# Patient Record
Sex: Male | Born: 1965 | Race: White | Hispanic: No | Marital: Single | Smoking: Former smoker
Health system: Southern US, Community
[De-identification: ages and names within clinical notes are randomized; demographics above are authoritative.]

---

## 1985-07-18 DIAGNOSIS — F29 Unspecified psychosis not due to a substance or known physiological condition: Secondary | ICD-10-CM | POA: Insufficient documentation

## 2008-03-26 ENCOUNTER — Ambulatory Visit: Payer: Self-pay | Admitting: Family Medicine

## 2009-09-19 IMAGING — CR DG THORACIC SPINE 2-3V
1 series · 2 of 2 positions shown · non-contrast
Comparison: none

REASON FOR EXAM: thoracic spine pain
COMMENTS:

[Series 2: view not recorded · 0.17mm/px · 2 of 2 slices shown]
[im 1/2]
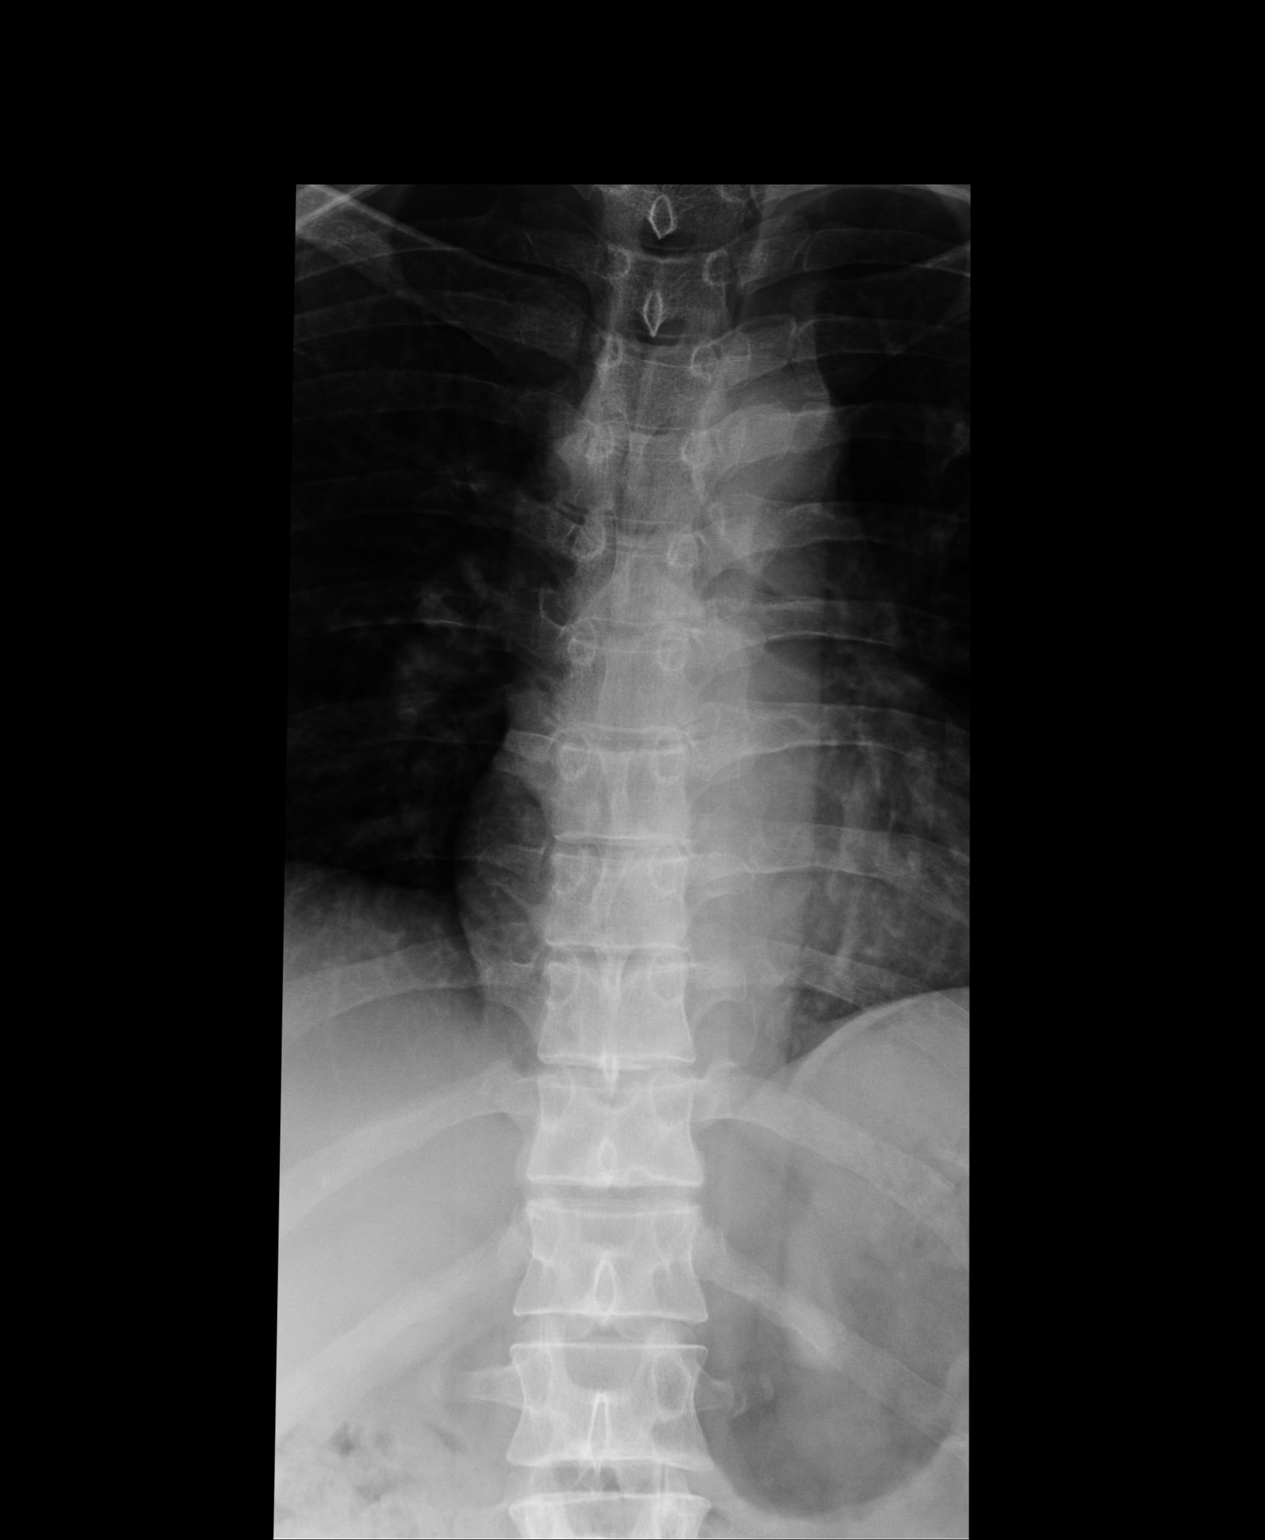
[im 2/2]
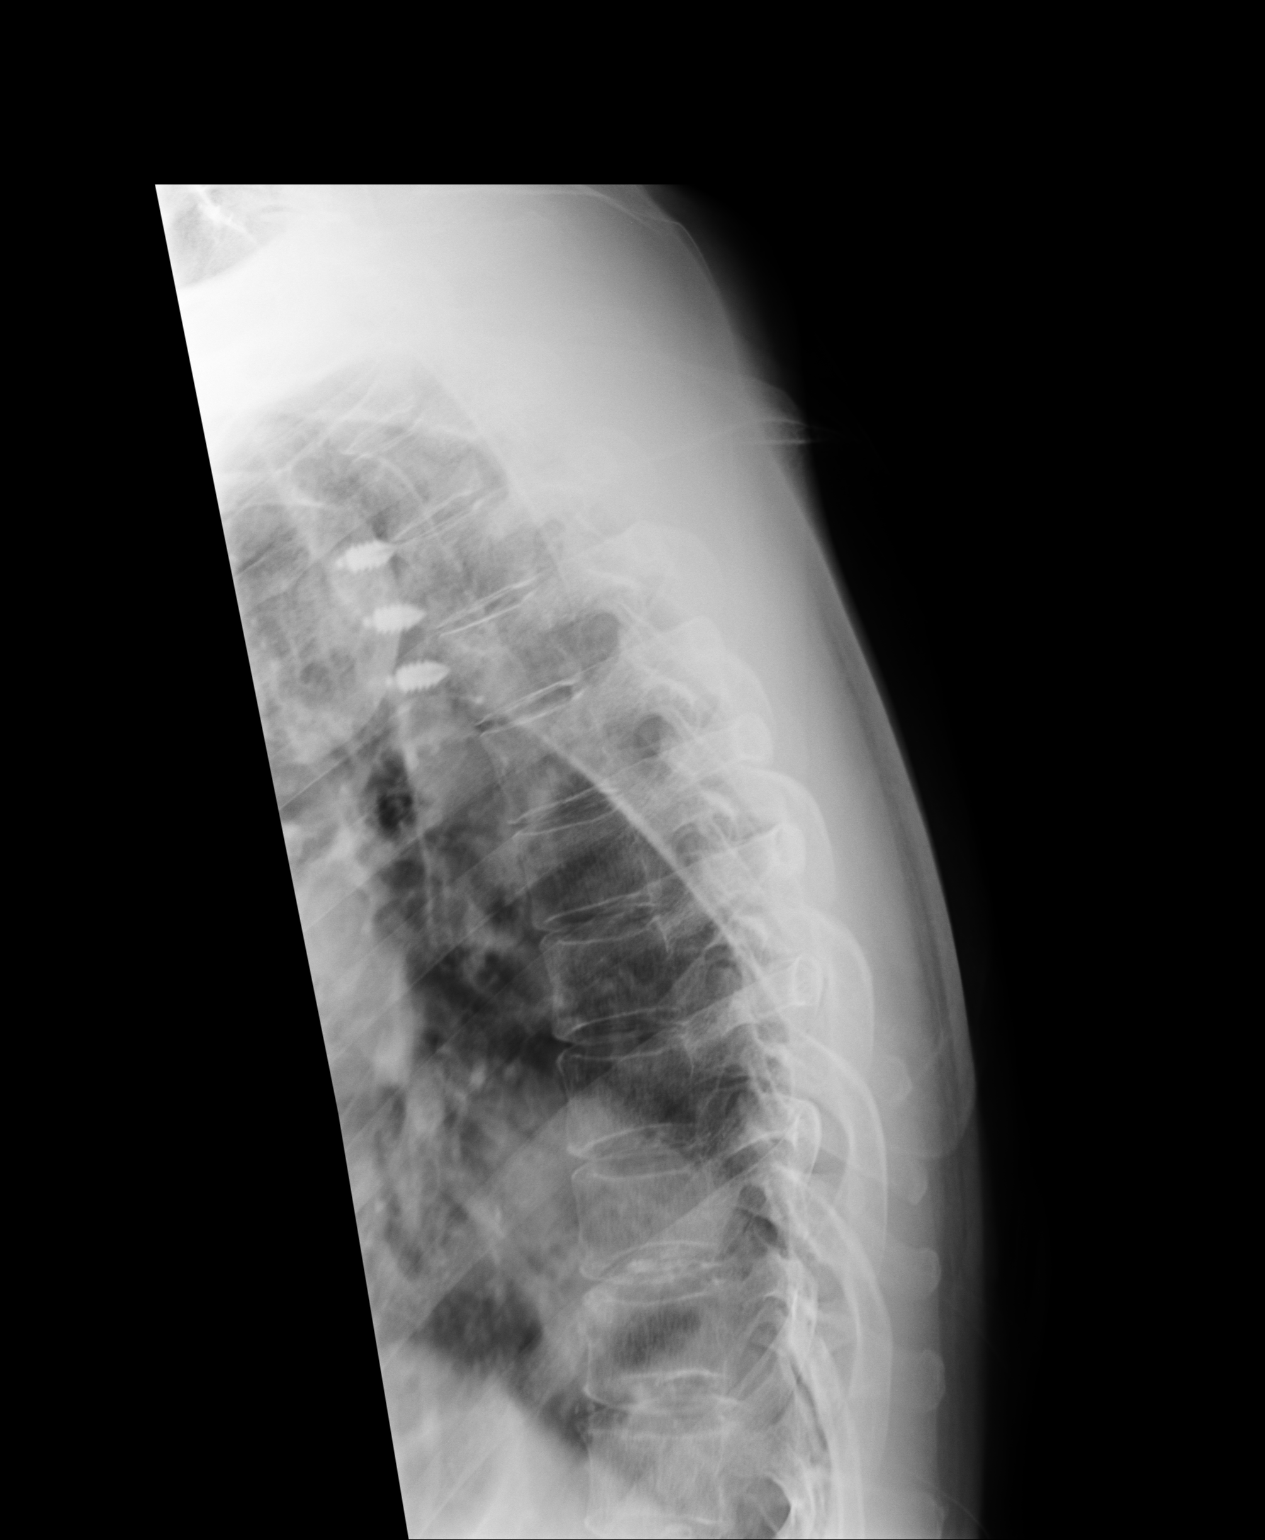

[2 of 2 positions shown; findings below may reference images not displayed]

PROCEDURE:     KDR - KDXR THORACIC AP AND LATERAL  - March 26, 2008 [DATE]

RESULT:     The vertebral body heights and the intervertebral disc spaces
are well maintained. The vertebral body alignment is normal. The pedicles
are bilaterally intact. There is noted a very slight curvature lumbar spine
with convexity to the RIGHT which could either be positional or secondary to
a slight scoliosis.
IMPRESSION: No acute changes are identified.

## 2015-10-05 DIAGNOSIS — Z5900 Homelessness unspecified: Secondary | ICD-10-CM

## 2015-10-05 DIAGNOSIS — IMO0001 Reserved for inherently not codable concepts without codable children: Secondary | ICD-10-CM

## 2015-10-05 NOTE — Congregational Nurse Program (Signed)
Client came to see CN mental health nurse to discuss, "I have not masturbated in one week."  When answering the question "What bothers you most?" this was his answer.  Client then started to discuss his current relationships with women and mentioning, "they have a phobia and I'm very concerned about it."  When questions redirected by CN to how he feels about his situation client did not answer directly and continued the discussion about his sexual and dating habits and about "women asking to be tied up."  Conversation per client tangential, does not answer questions directly about how he feels about his situation or what his choice would be in his encounters with his girlfriend and other women.  He did respond directly to questions related to the CN questionnaire and was compliant in asking them.  Client was asked about what medications he was on and he said he is not on any medications.  CN perceived the need for this client to see a counselor to discuss his relationship issues and he agreed to sign up and see Susanne Bordersyrus [employer] student tomorrow, Tuesday March 21.

## 2016-04-08 DIAGNOSIS — Z5900 Homelessness unspecified: Secondary | ICD-10-CM

## 2016-04-11 NOTE — Congregational Nurse Program (Signed)
Congregational Nurse Program Note  Date of Encounter: 04/08/2016  Past Medical History: No past medical history on file.  Encounter Details:     CNP Questionnaire - 04/08/16 0828      Patient Demographics   Is this a new or existing patient? New   Patient is considered a/an Not Applicable   Race Caucasian/White     Patient Assistance   Location of Patient Assistance Not Applicable   Patient's financial/insurance status Orange Card/Care Connects   Uninsured Patient Yes   Patient referred to apply for the following financial assistance Orange [employer]/Care Connects   Food insecurities addressed Referred to food Product managerbank or resource   Transportation assistance No   Assistance securing medications No   Sales promotion account executiveducational health offerings Behavioral health;Interpersonal relationships;Navigating the healthcare system     Encounter Details   Primary purpose of visit Education/Health Concerns;Navigating the Healthcare System   Was an Emergency Department visit averted? Yes   Does patient have a medical provider? Yes   Patient referred to Follow up with established PCP   Was a mental health screening completed? (GAINS tool) No   Does patient have dental issues? Yes   Does patient have vision issues? No   Since previous encounter, have you referred patient for abnormal blood pressure that resulted in a new diagnosis or medication change? No   Since previous encounter, have you referred patient for abnormal blood glucose that resulted in a new diagnosis or medication change? No     Requested to see Mental health nurse, states he believes he is not able to "get a date" with a woman because they all know he has not climaxed in 30 years.  Believes people at his previous job are talking about this and the women are telling others. 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  Enc[facility]

## 2017-03-24 NOTE — Congregational Nurse Program (Signed)
Congregational Nurse Program Note  Date of Encounter: 03/06/2017  Past Medical History: No past medical history on file.  Encounter Details:     CNP Questionnaire - 03/06/17 0907      Patient Demographics   Is this a new or existing patient? Existing   Patient is considered a/an Not Applicable   Race Caucasian/White     Patient Assistance   Location of Patient Assistance Not Applicable   Patient's financial/insurance status Low Income;Self-Pay (Uninsured)   Uninsured Patient (Orange Research officer, trade unionCard/Care Connects) Yes   Patient referred to apply for the following financial assistance Not Applicable   Food insecurities addressed Not Applicable   Transportation assistance No   Assistance securing medications No   Educational Designer, television/film sethealth offerings Behavioral health     Encounter Details   Primary purpose of visit Other   Was an Emergency Department visit averted? Not Applicable   Does patient have a medical provider? Yes   Patient referred to Not Applicable   Was a mental health screening completed? (GAINS tool) No   Does patient have dental issues? No   Does patient have vision issues? No   Does your patient have an abnormal blood pressure today? No   Since previous encounter, have you referred patient for abnormal blood pressure that resulted in a new diagnosis or medication change? No   Does your patient have an abnormal blood glucose today? No   Since previous encounter, have you referred patient for abnormal blood glucose that resulted in a new diagnosis or medication change? No   Was there a life-saving intervention made? No     States he plans to go to [place] on 8[place]22.  States he has a bus ticket.  He believes he will have more opportunities to acquire the services of a prostitute to prove he is not impotent.   Continues to believe his landlord of 10 years ago is sabatodging his ability to acquire work and housing.  He also believes the past landlord is calling women who he might have  a sexual relationship with a "SI/HI" and not open to counseling or medication

## 2017-03-24 NOTE — Congregational Nurse Program (Signed)
Congregational Nurse Program Note  Date of Encounter: 03/08/2017  Past Medical History: No past medical history on file.  Encounter Details:     CNP Questionnaire - 03/08/17 0912      Patient Demographics   Is this a new or existing patient? Existing   Patient is considered a/an Not Applicable   Race Caucasian/White     Patient Assistance   Location of Patient Assistance Not Applicable   Patient's financial/insurance status Low Income;Self-Pay (Uninsured)   Uninsured Patient (Orange Research officer, trade unionCard/Care Connects) Yes   Patient referred to apply for the following financial assistance Not Applicable   Food insecurities addressed Not Applicable   Transportation assistance No   Assistance securing medications No   Educational Designer, television/film sethealth offerings Behavioral health     Encounter Details   Primary purpose of visit Other   Was an Emergency Department visit averted? Not Applicable   Does patient have a medical provider? Yes   Patient referred to Not Applicable   Was a mental health screening completed? (GAINS tool) No   Does patient have dental issues? No   Does patient have vision issues? No   Does your patient have an abnormal blood pressure today? No   Since previous encounter, have you referred patient for abnormal blood pressure that resulted in a new diagnosis or medication change? No   Does your patient have an abnormal blood glucose today? No   Since previous encounter, have you referred patient for abnormal blood glucose that resulted in a new diagnosis or medication change? No   Was there a life-saving intervention made? No      Client [place] leave [place].  States wants to go to [place] where there is a high school friend of his.  Client has not had contact with this "friend" for several years.  Encouraged client to seek behavioral health services.  Client continues to not be open to services.  Does not exhibit SI/HI

## 2017-03-24 NOTE — Congregational Nurse Program (Signed)
Congregational Nurse Program Note  Date of Encounter: 03/01/2017  Past Medical History: No past medical history on file.  Encounter Details:     CNP Questionnaire - 03/01/17 0859      Patient Demographics   Is this a new or existing patient? New   Patient is considered a/an Not Applicable   Race Caucasian/White     Patient Assistance   Location of Patient Assistance Not Applicable   Patient's financial/insurance status Low Income;Self-Pay (Uninsured)   Uninsured Patient (Orange Research officer, trade unionCard/Care Connects) Yes   Patient referred to apply for the following financial assistance Not Applicable   Food insecurities addressed Not Applicable   Transportation assistance No   Assistance securing medications No   Educational Designer, television/film sethealth offerings Behavioral health     Encounter Details   Primary purpose of visit Other   Was an Emergency Department visit averted? Not Applicable   Does patient have a medical provider? Yes   Patient referred to Not Applicable   Was a mental health screening completed? (GAINS tool) No   Does patient have dental issues? No   Does patient have vision issues? No   Does your patient have an abnormal blood pressure today? No   Since previous encounter, have you referred patient for abnormal blood pressure that resulted in a new diagnosis or medication change? No   Does your patient have an abnormal blood glucose today? No   Since previous encounter, have you referred patient for abnormal blood glucose that resulted in a new diagnosis or medication change? No   Was there a life-saving intervention made? No     Saw client about a year ago, presenting with the same pre-occupation with "needing to have sex with a prostitute".  States he believes past women he has had sex with are telling other women on the street that  he is impotent.  The women he refers to are women who have been in his past several years ago, but are still "talking about" him.  States he believes his  landlord of several years ago are "rigging my phone, keeping me from getting another place to rent.  Client denies have behavioral health issues and does not think he needs medication or counseling.  States he [facility], but that has not helped.

## 2017-05-11 NOTE — Congregational Nurse Program (Signed)
Congregational Nurse Program Note  Date of Encounter: 04/21/2017  Past Medical History: No past medical history on file.  Encounter Details:     CNP Questionnaire - 04/21/17 1842      Patient Demographics   Is this a new or existing patient? Existing   Patient is considered a/an Not Applicable   Race Caucasian/White     Patient Assistance   Location of Patient Assistance Not Applicable   Patient's financial/insurance status Low Income;Self-Pay (Uninsured)   Uninsured Patient (Orange Research officer, trade unionCard/Care Connects) Yes   Patient referred to apply for the following financial assistance Not Applicable   Food insecurities addressed Not Applicable   Transportation assistance No   Assistance securing medications No   Educational Designer, television/film sethealth offerings Behavioral health     Encounter Details   Primary purpose of visit Other   Was an Emergency Department visit averted? Not Applicable   Does patient have a medical provider? Yes   Patient referred to Not Applicable   Was a mental health screening completed? (GAINS tool) No   Does patient have dental issues? No   Does patient have vision issues? No   Does your patient have an abnormal blood pressure today? No   Since previous encounter, have you referred patient for abnormal blood pressure that resulted in a new diagnosis or medication change? No   Does your patient have an abnormal blood glucose today? No   Since previous encounter, have you referred patient for abnormal blood glucose that resulted in a new diagnosis or medication change? No   Was there a life-saving intervention made? No     Client has returned from [place].  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  Discussed with him the risk given his sexual pre-occupation with young girls and strongly suggested he not apply for jobs where he could be at risk for "acting out" sexually.

## 2017-05-11 NOTE — Congregational Nurse Program (Signed)
Congregational Nurse Program Note  Date of Encounter: 04/26/2017  Past Medical History: No past medical history on file.  Encounter Details:     CNP Questionnaire - 04/26/17 1848      Patient Demographics   Is this a new or existing patient? Existing   Patient is considered a/an Not Applicable   Race Caucasian/White     Patient Assistance   Location of Patient Assistance Not Applicable   Patient's financial/insurance status Low Income;Self-Pay (Uninsured)   Uninsured Patient (Orange Research officer, trade unionCard/Care Connects) Yes   Patient referred to apply for the following financial assistance Not Applicable   Food insecurities addressed Not Applicable   Transportation assistance No   Assistance securing medications No   Educational Designer, television/film sethealth offerings Behavioral health     Encounter Details   Primary purpose of visit Other   Was an Emergency Department visit averted? Not Applicable   Does patient have a medical provider? Yes   Patient referred to Not Applicable   Was a mental health screening completed? (GAINS tool) No   Does patient have dental issues? No   Does patient have vision issues? No   Does your patient have an abnormal blood pressure today? No   Since previous encounter, have you referred patient for abnormal blood pressure that resulted in a new diagnosis or medication change? No   Does your patient have an abnormal blood glucose today? No   Since previous encounter, have you referred patient for abnormal blood glucose that resulted in a new diagnosis or medication change? No   Was there a life-saving intervention made? No     States [place] he has a [place] interview with a potential job.  Reality testing is poor.  Continues to refuse counseling

## 2017-06-21 NOTE — Congregational Nurse Program (Signed)
Congregational Nurse Program Note  Date of Encounter: 05/22/2017  Past Medical History: No past medical history on file.  Encounter Details:  [place].  States is still seeking a job and a woman willing to have sex with him.  Client trying to decide whether to stay in [place] or go to [place].  Still voices reluctance about counseling.

## 2017-07-21 NOTE — Congregational Nurse Program (Signed)
Congregational Nurse Program Note  Date of Encounter: 07/21/2017  Past Medical History: No past medical history on file.  Encounter Details: CNP Questionnaire - 07/21/17 1154      Questionnaire   Patient Status  Not Applicable    Race  White or Caucasian    Location Patient Served At  Not Applicable    Insurance  Not Applicable    Uninsured  Uninsured (NEW 1x/quarter)    Food  Yes, have food insecurities;Within past 12 months, worried food would run out with no money to buy more;Within past 12 months, food ran out with no money to buy more    Housing/Utilities  No permanent housing    Transportation  Yes, need transportation assistance    Interpersonal Safety  Yes, feel physically and emotionally safe where you currently live    Medication  Yes, have medication insecurities    Medical Provider  Yes    Referrals  Behavioral/Mental Health Provider    ED Visit Averted  Not Applicable    Life-Saving Intervention Made  Not Applicable      States he thinks he will stay here in [place].  Asked for assistance with getting orange card.  States is looking for a church to attend.  States he is really not religous, but thinks belonging to a group such as that would be helpful.

## 2017-07-21 NOTE — Congregational Nurse Program (Signed)
Congregational Nurse Program Note  Date of Encounter: 07/14/2017  Past Medical History: No past medical history on file.  Encounter Details: CNP Questionnaire - 07/14/17 1144      Questionnaire   Patient Status  Not Applicable    Race  Black or African American    Location Patient Served At  Not Applicable    Insurance  Not Applicable    Uninsured  Uninsured (Subsequent visits/quarter)    Food  Yes, have food insecurities;Within past 12 months, worried food would run out with no money to buy more;Within past 12 months, food ran out with no money to buy more    Housing/Utilities  No permanent housing    Transportation  Yes, need transportation assistance    Interpersonal Safety  Yes, feel physically and emotionally safe where you currently live    Medication  Yes, have medication insecurities    Medical Provider  Yes    Referrals  Behavioral/Mental Health Provider    ED Visit Averted  Not Applicable    Life-Saving Intervention Made  Not Applicable      Continues to express uncertainty as to where he wants to go, [place] or [place].  States [place] is where he lived before, and has friends there, although he has not been in contact with these friends for several years.  Continues to be fixated on paranoid delusion that his former employer of 10-12 years ago is controlling his life.  Also fixated on the belief that "my life would be on track if I could complete having sexual intercourse with an escort".  States did not f/t with [employer].  States [facility] [place] he saw his former employer picture on a Therapist, musicpharmaceutical pamphlet.  He believe [employer] have been in contact with his former employer and the employer "told lies about me".  Client's believes this person uses technology and other creative methods to spy on him and talks to possible employers (which is why he believes he cannot get a job) and talks to the women who might have an interest in him.  Expressed to client  concerns about his general safety (shelter and food).  He states he has applied for winter emergency shelter.  Currently he staying [place] in the woods.

## 2017-07-21 NOTE — Congregational Nurse Program (Signed)
Congregational Nurse Program Note  Date of Encounter: 06/28/2017  Past Medical History: No past medical history on file.  Encounter Details: CNP Questionnaire - 06/28/17 1137      Questionnaire   Patient Status  Not Applicable    Race  Black or African American    Location Patient Served At  Not Applicable    Insurance  Not Applicable    Uninsured  Uninsured (Subsequent visits/quarter)    Food  Yes, have food insecurities;Within past 12 months, worried food would run out with no money to buy more;Within past 12 months, food ran out with no money to buy more    Housing/Utilities  No permanent housing    Transportation  Yes, need transportation assistance    Interpersonal Safety  Yes, feel physically and emotionally safe where you currently live    Medication  Yes, have medication insecurities    Medical Provider  Yes    Referrals  Not Applicable    ED Visit Averted  Not Applicable    Life-Saving Intervention Made  Not Applicable      Well fare check. Still not sure where he wants to go.  Comparing his options.  Encouraged him to look at the realities of his choices:  Transportation and shelter.

## 2017-07-21 NOTE — Congregational Nurse Program (Signed)
Congregational Nurse Program Note  Date of Encounter: 07/06/2017  Past Medical History: No past medical history on file.  Encounter Details: CNP Questionnaire - 07/06/17 1139      Questionnaire   Patient Status  Not Applicable    Race  Black or African American    Location Patient Served At  Not Applicable    Insurance  Not Applicable    Uninsured  Uninsured (Subsequent visits/quarter)    Food  Yes, have food insecurities;Within past 12 months, worried food would run out with no money to buy more;Within past 12 months, food ran out with no money to buy more    Housing/Utilities  No permanent housing    Transportation  Yes, need transportation assistance    Interpersonal Safety  Yes, feel physically and emotionally safe where you currently live    Medication  Yes, have medication insecurities    Medical Provider  Yes    Referrals  Not Applicable    ED Visit Averted  Not Applicable    Life-Saving Intervention Made  Not Applicable      Client continues to ruminate about finding a "mutual partner to have sex with".  States is not interested in a relationship, just wants a male with mutual interest to have sex with.  States he thinks he might go to [place] as there is a woman there who has shown interest in him.  He does not know her name or anything about her, only that he saw her in passing.  He has hopes that going to [place] he can find her and pursue a sexual relationship with her.  Client did state [facility].  Strongly encouraged him to do so.

## 2017-08-21 ENCOUNTER — Ambulatory Visit (HOSPITAL_COMMUNITY)
Admission: EM | Admit: 2017-08-21 | Discharge: 2017-08-21 | Disposition: A | Discharge status: Other Acute Inpatient Hospital | Payer: Self-pay

## 2017-08-25 NOTE — Congregational Nurse Program (Signed)
Congregational Nurse Program Note  Date of Encounter: 07/24/2017  Past Medical History: No past medical history on file.  Encounter Details:  Continues to focus on relocating.  States is applying for jobs but is uncertain as to where he wants to go.  States his priority is to obtain a job so he can pay for an escort.  S.

## 2017-08-25 NOTE — Congregational Nurse Program (Signed)
Congregational Nurse Program Note  Date of Encounter: 08/04/2017  Past Medical History: No past medical history on file.  Encounter Details: CNP Questionnaire - 08/04/17 1050      Questionnaire   Patient Status  Not Applicable    Race  White or Caucasian    Location Patient Served At  Not Applicable    Insurance  Not Applicable    Uninsured  Uninsured (NEW 1x/quarter)    Food  Yes, have food insecurities;Within past 12 months, worried food would run out with no money to buy more;Within past 12 months, food ran out with no money to buy more    Housing/Utilities  No permanent housing    Transportation  Yes, need transportation assistance    Interpersonal Safety  Yes, feel physically and emotionally safe where you currently live    Medication  Yes, have medication insecurities    Medical Provider  Yes    Referrals  Behavioral/Mental Health Provider    ED Visit Averted  Not Applicable    Life-Saving Intervention Made  Not Applicable      States is now working 20 hours a week.  Considering relocating to [place].  Discussed the pro's and con's of relocating now in the winter.  States he thinks he will stay in [place] until March

## 2017-09-18 NOTE — Congregational Nurse Program (Signed)
Congregational Nurse Program Note  Date of Encounter: 09/18/2017  Past Medical History: No past medical history on file.  Encounter Details: CNP Questionnaire - 09/18/17 1512      Questionnaire   Patient Status  Not Applicable    Race  White or Caucasian    Location Patient Served At  Not Applicable    Insurance  Not Applicable    Uninsured  Uninsured (Subsequent visits/quarter)    Food  Yes, have food insecurities;Within past 12 months, worried food would run out with no money to buy more;Within past 12 months, food ran out with no money to buy more    Housing/Utilities  No permanent housing    Transportation  Yes, need transportation assistance    Interpersonal Safety  Yes, feel physically and emotionally safe where you currently live    Medication  Yes, have medication insecurities    Medical Provider  Yes    Referrals  Behavioral/Mental Health Provider    ED Visit Averted  Not Applicable    Life-Saving Intervention Made  Not Applicable      Client reports he has purchased a plane ticket and has decided to relocate to [place].  Is looking for an area to settle where there is a [facility].  Has been applying for jobs in the area.  Encouraged him to follow through with psychiatric care.
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
## 2018-05-24 NOTE — Congregational Nurse Program (Signed)
Did not go to the job interview.  Processing where he wants to go.  Thinking about going to [place].  Discussed mental health treatment.  Client is ambivalent.

## 2018-05-24 NOTE — Congregational Nurse Program (Signed)
Client is requesting assistance with a MH provider.  Client does not want to go to [facility] and he can no longer go to [facility] due to his hypersexual discussions.  Will explore with client options
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
## 2018-06-21 NOTE — Congregational Nurse Program (Signed)
Delusions continue to be present.  Client believes people are "talking" to him and telling him to do things.  These are people he is finding on facebook.  He believes they are telling him through implication to go to [place].  Client has no transportation nor no money.  He is currently at the [facility].  Discussed with client the concerns about safety if he made such a decision.  Client agreed it would be best for him to stay in [place] at least for the winter
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
## 2018-06-21 NOTE — Congregational Nurse Program (Signed)
Client reports he has a job opportunity.  He is scheduled to obtain a UA for drug screen today.  Bus passes given.  Client remains unsure if he wants to stay in [place].  Talking today about going to [place].

## 2018-06-21 NOTE — Congregational Nurse Program (Signed)
Client continues to be ambivalent about staying her or going to [place]
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
## 2018-09-10 ENCOUNTER — Encounter: Payer: Self-pay | Admitting: Pediatric Intensive Care
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
## 2018-09-24 ENCOUNTER — Encounter: Payer: Self-pay | Admitting: Pediatric Intensive Care

## 2018-10-02 ENCOUNTER — Encounter: Payer: Self-pay | Admitting: Pediatric Intensive Care

## 2018-10-26 NOTE — Congregational Nurse Program (Signed)
  Dept: [phone]   Congregational Nurse Program Note  Date of Encounter: 09/24/2018  Past Medical History: No past medical history on file.  Encounter Details:Cleint continues circular thinking regarding "landlord has slandered me" and "I think I need to move to [place] or [place] to get away from the people who are slandering me". Client states wanting to go to [facility] in [facility] for "treatment" but also states that he doesn't have "psychologic problems". CN encouraged client to call [facility] to establish care.  Shann Medal RN BSN CNP [phone]

## 2018-10-26 NOTE — Congregational Nurse Program (Signed)
  Dept: [phone]   Congregational Nurse Program Note  Date of Encounter: 10/02/2018  Past Medical History: No past medical history on file.  Encounter Details:Client checks in to states he is leaving for [place] or [place]. Shann Medal RN BSN CNP [phone]
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
## 2020-10-09 ENCOUNTER — Other Ambulatory Visit: Payer: Self-pay

## 2020-10-09 ENCOUNTER — Encounter: Payer: Self-pay | Admitting: Emergency Medicine

## 2020-10-09 ENCOUNTER — Emergency Department
Admission: EM | Admit: 2020-10-09 | Discharge: 2020-10-12 | Disposition: A | Discharge status: Other Acute Inpatient Hospital | Payer: Self-pay | Attending: Emergency Medicine | Admitting: Emergency Medicine

## 2020-10-09 DIAGNOSIS — F200 Paranoid schizophrenia: Secondary | ICD-10-CM

## 2020-10-09 DIAGNOSIS — F22 Delusional disorders: Secondary | ICD-10-CM | POA: Insufficient documentation

## 2020-10-09 DIAGNOSIS — Z20822 Contact with and (suspected) exposure to covid-19: Secondary | ICD-10-CM | POA: Insufficient documentation

## 2020-10-09 DIAGNOSIS — Z87891 Personal history of nicotine dependence: Secondary | ICD-10-CM | POA: Insufficient documentation

## 2020-10-09 LAB — SALICYLATE LEVEL: Salicylate Lvl: <7 mg/dL — ABNORMAL LOW (ref 7.0–30.0)

## 2020-10-09 LAB — COMPREHENSIVE METABOLIC PANEL
ALT: 33 U/L (ref 0–44)
AST: 34 U/L (ref 15–41)
Albumin: 4.3 g/dL (ref 3.5–5.0)
Alkaline Phosphatase: 52 U/L (ref 38–126)
Anion gap: 9 (ref 5–15)
BUN: 22 mg/dL — ABNORMAL HIGH (ref 6–20)
CO2: 24 mmol/L (ref 22–32)
Calcium: 9.4 mg/dL (ref 8.9–10.3)
Chloride: 104 mmol/L (ref 98–111)
Creatinine, Ser: 1.26 mg/dL — ABNORMAL HIGH (ref 0.61–1.24)
GFR, Estimated: >60 mL/min (ref >60)
Glucose, Bld: 128 mg/dL — ABNORMAL HIGH (ref 70–99)
Potassium: 3.8 mmol/L (ref 3.5–5.1)
Sodium: 137 mmol/L (ref 135–145)
Total Bilirubin: 0.8 mg/dL (ref 0.3–1.2)
Total Protein: 7.2 g/dL (ref 6.5–8.1)

## 2020-10-09 LAB — RESP PANEL BY RT-PCR (FLU A&B, COVID) ARPGX2
Influenza A by PCR: NEGATIVE
Influenza B by PCR: NEGATIVE
SARS Coronavirus 2 by RT PCR: NEGATIVE

## 2020-10-09 LAB — CBC
HCT: 40.8 % (ref 39.0–52.0)
Hemoglobin: 13.8 g/dL (ref 13.0–17.0)
MCH: 29.9 pg (ref 26.0–34.0)
MCHC: 33.8 g/dL (ref 30.0–36.0)
MCV: 88.5 fL (ref 80.0–100.0)
Platelets: 344 10*3/uL (ref 150–400)
RBC: 4.61 MIL/uL (ref 4.22–5.81)
RDW: 12.7 % (ref 11.5–15.5)
WBC: 9.5 10*3/uL (ref 4.0–10.5)
nRBC: 0 % (ref 0.0–0.2)

## 2020-10-09 LAB — ACETAMINOPHEN LEVEL: Acetaminophen (Tylenol), Serum: <10 ug/mL — ABNORMAL LOW (ref 10–30)

## 2020-10-09 LAB — ETHANOL: Alcohol, Ethyl (B): <10 mg/dL (ref <10)

## 2020-10-09 MED ORDER — LORAZEPAM 1 MG PO TABS
1.0000 mg | ORAL_TABLET | Freq: Once | ORAL | Status: AC
  Administered 2020-10-09: 1 mg via ORAL
  Filled 2020-10-09: qty 1
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
## 2020-10-10 DIAGNOSIS — F200 Paranoid schizophrenia: Secondary | ICD-10-CM

## 2020-10-10 LAB — URINE DRUG SCREEN, QUALITATIVE (ARMC ONLY)
Amphetamines, Ur Screen: NOT DETECTED
Barbiturates, Ur Screen: NOT DETECTED
Benzodiazepine, Ur Scrn: NOT DETECTED
Cannabinoid 50 Ng, Ur ~~LOC~~: NOT DETECTED
Cocaine Metabolite,Ur ~~LOC~~: NOT DETECTED
MDMA (Ecstasy)Ur Screen: NOT DETECTED
Methadone Scn, Ur: NOT DETECTED
Opiate, Ur Screen: NOT DETECTED
Phencyclidine (PCP) Ur S: NOT DETECTED
Tricyclic, Ur Screen: NOT DETECTED

## 2020-10-10 MED ORDER — LORAZEPAM 1 MG PO TABS: 1.0000 mg | ORAL_TABLET | Freq: Four times a day (QID) | ORAL | Status: DC | PRN

## 2020-10-10 MED ORDER — OLANZAPINE 5 MG PO TBDP
5.0000 mg | ORAL_TABLET | Freq: Every day | ORAL | Status: DC
  Administered 2020-10-10 – 2020-10-11 (×2): 5 mg via ORAL
  Filled 2020-10-10 (×3): qty 1

## 2020-10-10 NOTE — BH Assessment (Signed)
Referral information for Psychiatric Hospitalization faxed to;   [facility] [phone]- [phone]) [phone]  . [place] [phone]),  . [facility] [phone]),   . [place] [phone] or [phone]),   . [place] [phone] or [phone])

## 2020-10-10 NOTE — ED Notes (Signed)
IVC pending placement 

## 2020-10-10 NOTE — ED Notes (Signed)
Hourly rounding completed at this time, patient currently asleep in room. No complaints, stable, and in no acute distress. Q15 minute rounds and monitoring via Security Cameras to continue. 

## 2020-10-10 NOTE — ED Provider Notes (Signed)
Emergency Medicine Observation Re-evaluation Note  EUGEN JEANSONNE is a 55 y.o. male, seen on rounds today.  Pt initially presented to the ED for complaints of anger issues, Delusional, and Schizophrenia Currently, the patient is sleeping comfortably.  Physical Exam  BP (!) 150/83 (BP Location: Left Arm)   Pulse 73   Temp 99.1 F (37.3 C) (Oral)   Resp 16   Ht 5\' 11"  (1.803 m)   Wt 86.6 kg   SpO2 98%   BMI 26.64 kg/m  Physical Exam Constitutional:      Appearance: He is not ill-appearing or toxic-appearing.  HENT:     Head: Atraumatic.  Cardiovascular:     Comments: Well perfused Pulmonary:     Effort: Pulmonary effort is normal.  Abdominal:     General: There is no distension.  Musculoskeletal:        General: No deformity.  Skin:    Findings: No rash.  Neurological:     General: No focal deficit present.     Cranial Nerves: No cranial nerve deficit.      ED Course / MDM  EKG:   I have reviewed the labs performed to date as well as medications administered while in observation.  Recent changes in the last 24 hours include none.  Plan  Current plan is for inpatient psychiatric care. Patient is under full IVC at this time.   , MD 10/10/20 [phone]

## 2020-10-10 NOTE — ED Notes (Signed)
Pt given hand sanitizer and additional ice water

## 2020-10-10 NOTE — ED Notes (Signed)
Report to chris, rn. Pt to [facility] with security escort.

## 2020-10-10 NOTE — BH Assessment (Signed)
Comprehensive Clinical Assessment (CCA) Note  10/10/2020 Brett Reid [id number]  Chief Complaint: Patient is a 55 year old male presenting to [facility] ED under IVC. Per triage note [name] voluntarily with BPD with c/o of being angry at former landlords blocking pt's attempt to gain employment. Pt denies SI/HI and hallucinations. During assessment patient appears alert and oriented x4, calm and cooperative, patient thought process appears to be disorganized and appears to be having delusions. 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 Patient denies any past mental health issues but has been in a [employer], [employer] program where patient has identified these paranoid delusions for years and has been diagnosed in 45 for Paranoid Schizophrenia by Brett [facility]. Patient denies SI/HI/AH/VH and does not appear to be responding to any internal or external stimuli.  Per Psyc NP Lerry Liner patient is recommended for Inpatient Hospitalization  Chief Complaint  Patient presents with  . anger issues  . Delusional  . Schizophrenia   Visit Diagnosis: Paranoid Schizophrenia   CCA Screening, Triage and Referral (STR)  Patient Reported Information How did you hear about [place]? Legal System  Referral name: No data recorded Referral phone number: No data recorded  Whom do you see for routine medical problems? I don't have a doctor  Practice/Facility Name: No data recorded Practice/Facility Phone Number: No data recorded Name of Contact: No data recorded Contact Number:  No data recorded Contact Fax Number: No data recorded Prescriber Name: No data recorded Prescriber Address (if known): No data recorded  What Is the Reason for Your Visit/Call Today? No data recorded How Long Has This Been Causing You Problems? > than 6 months  What Do You Feel Would Help You the Most Today? No data recorded  Have You Recently Been in Any Inpatient Treatment (Hospital/Detox/Crisis [facility]/28-Day Program)? No  Name/Location of Program/Hospital:No data recorded How Long Were You There? No data recorded When Were You Discharged? No data recorded  Have You Ever Received Services From [facility] Before? No  Who Do You See at [facility]? No data recorded  Have You Recently Had Any Thoughts About Hurting Yourself? No  Are You Planning to Commit Suicide/Harm Yourself At This time? No   Have you Recently Had Thoughts About Hurting Someone Brett Reid? No  Explanation: No data recorded  Have You Used Any Alcohol or Drugs in the Past 24 Hours? No  How Long Ago Did You Use Drugs or Alcohol? No data recorded What Did You Use and How Much? No data recorded  Do You Currently Have a Therapist/Psychiatrist? No  Name of Therapist/Psychiatrist: No data recorded  Have You Been Recently Discharged From Any Office Practice or Programs? No  Explanation of Discharge From Practice/Program: No data recorded    CCA Screening Triage Referral Assessment Type of Contact: Face-to-Face  Is this Initial or Reassessment? No data recorded Date Telepsych consult ordered in [facility]:  No data recorded Time Telepsych consult ordered in [facility]:  No data recorded  Patient Reported Information Reviewed? Yes  Patient Left Without  Being Seen? No data recorded Reason for Not Completing Assessment: No data recorded  Collateral Involvement: No data recorded  Does Patient Have a Court Appointed Legal Guardian? No data recorded Name and Contact of Legal Guardian: No data recorded If Minor and Not  Living with Parent(s), Who has Custody? No data recorded Is CPS involved or ever been involved? Never  Is APS involved or ever been involved? Never   Patient Determined To Be At Risk for Harm To Self or Others Based on Review of Patient Reported Information or Presenting Complaint? No  Method: No data recorded Availability of Means: No data recorded Intent: No data recorded Notification Required: No data recorded Additional Information for Danger to Others Potential: No data recorded Additional Comments for Danger to Others Potential: No data recorded Are There Guns or Other Weapons in Your Home? No data recorded Types of Guns/Weapons: No data recorded Are These Weapons Safely Secured?                            No data recorded Who Could Verify You Are Able To Have These Secured: No data recorded Do You Have any Outstanding Charges, Pending Court Dates, Parole/Probation? No data recorded Contacted To Inform of Risk of Harm To Self or Others: No data recorded  Location of Assessment: [facility]   Does Patient Present under Involuntary Commitment? Yes  IVC Papers Initial File Date: 10/10/2020   [place] of Residence: Brett Reid   Patient Currently Receiving the Following Services: No data recorded  Determination of Need: Emergent (2 hours)   Options For Referral: No data recorded    CCA Biopsychosocial Intake/Chief Complaint:  Patient presents due to anger issues  Current Symptoms/Problems: Patient presents due to anger issues, but appears to be having some delusions   Patient Reported Schizophrenia/Schizoaffective Diagnosis in Past: Yes   Strengths: Patietn is able to communicate  Preferences: Unknown  Abilities: Unknown   Type of Services Patient Feels are Needed: Unknown   Initial Clinical Notes/Concerns: None   Mental Health Symptoms Depression:  None   Duration of Depressive symptoms: No data recorded  Mania:  Racing thoughts   Anxiety:   Difficulty  concentrating; Worrying   Psychosis:  Delusions   Duration of Psychotic symptoms: Greater than six months   Trauma:  None   Obsessions:  Recurrent & persistent thoughts/impulses/images   Compulsions:  Absent insight/delusional   Inattention:  None   Hyperactivity/Impulsivity:  N/A   Oppositional/Defiant Behaviors:  None   Emotional Irregularity:  None   Other Mood/Personality Symptoms:  No data recorded   Mental Status Exam Appearance and self-care  Stature:  Average   Weight:  Average weight   Clothing:  Casual   Grooming:  Normal   Cosmetic use:  None   Posture/gait:  Normal   Motor activity:  Not Remarkable   Sensorium  Attention:  Inattentive   Concentration:  Focuses on irrelevancies   Orientation:  X5   Recall/memory:  Normal   Affect and Mood  Affect:  Congruent   Mood:  Anxious   Relating  Eye contact:  Normal   Facial expression:  Responsive   Attitude toward examiner:  Cooperative   Thought and Language  Speech flow: Flight of Ideas   Thought content:  Delusions; Suspicious   Preoccupation:  Ruminations   Hallucinations:  None   Organization:  No data recorded  [employer] of Knowledge:  Fair   Intelligence:  Average   Abstraction:  Normal   Judgement:  Impaired   Reality Testing:  Distorted   Insight:  Lacking; Poor   Decision Making:  Normal   Social Functioning  Social Maturity:  Responsible   Social Judgement:  Normal   Stress  Stressors:  Housing   Coping Ability:  Contractor Deficits:  None   Supports:  Support needed     Religion: Religion/Spirituality Are You A Religious Person?: No  Leisure/Recreation: Leisure / Recreation Do You Have Hobbies?: No  Exercise/Diet: Exercise/Diet Do You Exercise?: No Have You Gained or Lost A Significant Amount of Weight in the Past Six Months?: No Do You Follow a Special Diet?: No Do You Have Any Trouble Sleeping?: No   CCA  Employment/Education Employment/Work Situation: Employment / Work Situation Employment situation: Biomedical scientist job has been impacted by current illness: No Has patient ever been in the [employer]?:  (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)  Education: Education Is Patient Currently Attending School?: No Did Garment/textile technologist From [employer]?:  (Unknown)   CCA Family/Childhood History Family and Relationship History: Family history Marital status: Single Are you sexually active?:  (Unknown) What is your sexual orientation?: Unknown Has your sexual activity been affected by drugs, alcohol, medication, or emotional stress?: Unknown Does patient have children?:  (Unknown)  Childhood History:  Childhood History Additional childhood history information: None reported Description of patient's relationship with caregiver when they were a child: None reported Patient's description of current relationship with people who raised him/her: None reported How were you disciplined when you got in trouble as a child/adolescent?: None reported Does patient have siblings?: No Did patient suffer any verbal/emotional/physical/sexual abuse as a child?: No Did patient suffer from severe childhood neglect?: No Has patient ever been sexually abused/assaulted/raped as an adolescent or adult?: No Was the patient ever a victim of a crime or a disaster?: No Witnessed domestic violence?: No Has patient been affected by domestic violence as an adult?: No  Child/Adolescent Assessment:     CCA Substance Use Alcohol/Drug Use: Alcohol / Drug Use Pain Medications: See MAR Prescriptions: See MAR Over the Counter: See MAR History of alcohol / drug use?: Yes Substance #1 Name of Substance 1: Alcohol 1 - Last Use / Amount: Reports occasional use of alcohol 1- Route of Use: Oral                       ASAM's:  Six Dimensions of Multidimensional  Assessment  Dimension 1:  Acute Intoxication and/or Withdrawal Potential:      Dimension 2:  Biomedical Conditions and Complications:      Dimension 3:  Emotional, Behavioral, or Cognitive Conditions and Complications:     Dimension 4:  Readiness to Change:     Dimension 5:  Relapse, Continued use, or Continued Problem Potential:     Dimension 6:  Recovery/Living Environment:     ASAM Severity Score:    ASAM Recommended Level of Treatment:     Substance use Disorder (SUD)    Recommendations for Services/Supports/Treatments:   Per Psyc NP Rashaun Dixon patient is recommended for Inpatient Hospitalization  DSM5 Diagnoses: Patient Active Problem List   Diagnosis Date Noted  . Paranoid schizophrenia (HCC) 10/09/2020    Patient Centered Plan: Patient is on the following Treatment Plan(s):  Schizophrenia   Referrals to Alternative Service(s): Referred to Alternative Service(s):   Place:   Date:   Time:  Referred to Alternative Service(s):   Place:   Date:   Time:    Referred to Alternative Service(s):   Place:   Date:   Time:    Referred to Alternative Service(s):   Place:   Date:   Time:     Sirena Riddle A Jazara Swiney, LCAS-A

## 2020-10-10 NOTE — ED Notes (Signed)
Patient transferred from ED to [facility] room 2 after screening for contraband. Report received from April RN including Situation, Background, Assessment and Recommendations. Pt oriented to unit including Q15 minute rounds as well as the security cameras for their protection. Patient is alert and oriented, warm and dry in no acute distress. Patient denies SI, HI, and AVH. Pt. Encouraged to let this nurse know if needs arise.

## 2020-10-10 NOTE — Consult Note (Addendum)
[facility] Face-to-Face Psychiatry Consult   Reason for Consult: Psychiatric consult Referring Physician: EDP Patient Identification: Brett Reid MRN:  [id number] Principal Diagnosis: Paranoid schizophrenia (HCC) Diagnosis:  Principal Problem:   Paranoid schizophrenia (HCC)   Total Time spent with patient: 45 minutes  Subjective:   Brett Reid is a 55 y.o. male patient admitted "I was having anger issues "  HPI:  Assessment  Brett Reid, 55 y.o., male patient presented to [facility].  Patient seen  by TTS and this provider; chart reviewed and consulted with Dr. Lucianne Muss on 10/10/20.  No psychiatric background, history or diagnosis was found in [facility] health EMR.  However on care everywhere, there was 1 medical documentation from the [facility]. The records shows patient was diagnosed with paranoid schizophrenia in 4.  There are several Educational psychologist program notes that state patient presented with similar complaints of landlord spying on him, tarnishing his reputation, and preventing him from obtaining employment. While reading those notes, writer was able to see patient encounters as far back as October 05, 2015 where often patient ruminated on sexual intercourse with random women, and the same statements that he made tonight in reference to his landlord trying to sabotage his employment.  At the end of each note it is recommended that patient seek mental health counseling and treatment.  Each time the patient declined.  Tonight he presents with the same delusions.    During evaluation Brett Reid is sitting on the bed; he is alert/oriented x 4; calm/cooperative; and mood congruent with affect.  Patient is speaking in a clear tone at moderate volume, and normal pace; with good eye contact.  His thought process is incoherent and irrelevant; There is no indication that he is currently responding to internal/external stimuli.  Patient is experiencing delusional thought content (variable  viable through EMR review ) and extreme paranoia.  Patient denies suicidal/self-harm and homicidal ideation. Patient has remained calm throughout assessment.   Current risk factors for patient are homelessness, medication noncompliance, delusional thoughts, paranoia and age and demographics.  Recommendations:  Psychiatric inpatient hospitalization for medication management and psychiatric stabilization.  It appears that the last time patient received treatment for his diagnosis of paranoid schizophrenia was in 2007 at the [place] in [place].  Patient states he is not currently taking any medication and he denies having a mental health background.   Past Psychiatric History: Paranoid schizophrenia  Risk to Self:  No Risk to Others:  Possibly Prior Inpatient Therapy:  No Prior Outpatient Therapy:  No  Past Medical History: History reviewed. No pertinent past medical history. History reviewed. No pertinent surgical history. Family History: History reviewed. No pertinent family history. Family Psychiatric  History: Unknown Social History:  Social History   Substance and Sexual Activity  Alcohol Use Not Currently     Social History   Substance and Sexual Activity  Drug Use Not Currently    Social History   Socioeconomic History  . Marital status: Single    Spouse name: Not on file  . Number of children: Not on file  . Years of education: Not on file  . Highest education level: Not on file  Occupational History  . Not on file  Tobacco Use  . Smoking status: Former Games developer  . Smokeless tobacco: Never Used  Substance and Sexual Activity  . Alcohol use: Not Currently  . Drug use: Not Currently  . Sexual activity: Not on file  Other Topics Concern  . Not on  file  Social History Narrative  . Not on file   Social Determinants of Health   Financial Resource Strain: Not on file  Food Insecurity: Not on file  Transportation Needs: Not on file  Physical Activity: Not on file  Stress:  Not on file  Social Connections: Not on file   Additional Social History:    Allergies:   Allergies  Allergen Reactions  . Haloperidol     Other reaction(s): NEAR SYNCOPE    Labs:  Results for orders placed or performed during the hospital encounter of 10/09/20 (from the past 48 hour(s))  Comprehensive metabolic panel     Status: Abnormal   Collection Time: 10/09/20  9:42 PM  Result Value Ref Range   Sodium 137 135 - 145 mmol/L   Potassium 3.8 3.5 - 5.1 mmol/L   Chloride 104 98 - 111 mmol/L   CO2 24 22 - 32 mmol/L   Glucose, Bld 128 (H) 70 - 99 mg/dL    Comment: Glucose reference range applies only to samples taken after fasting for at least 8 hours.   BUN 22 (H) 6 - 20 mg/dL   Creatinine, Ser 4.70 (H) 0.61 - 1.24 mg/dL   Calcium 9.4 8.9 - 96.2 mg/dL   Total Protein 7.2 6.5 - 8.1 g/dL   Albumin 4.3 3.5 - 5.0 g/dL   AST 34 15 - 41 U/L   ALT 33 0 - 44 U/L   Alkaline Phosphatase 52 38 - 126 U/L   Total Bilirubin 0.8 0.3 - 1.2 mg/dL   GFR, Estimated >83 >66 mL/min    Comment: (NOTE) Calculated using the CKD-EPI Creatinine Equation (2021)    Anion gap 9 5 - 15    Comment: Performed at [facility], [address] [place], [place] [address]  Ethanol     Status: None   Collection Time: 10/09/20  9:42 PM  Result Value Ref Range   Alcohol, Ethyl (B) <10 <10 mg/dL    Comment: (NOTE) Lowest detectable limit for serum alcohol is 10 mg/dL.  For medical purposes only. Performed at [facility], [address] [place], [place] [address]   cbc     Status: None   Collection Time: 10/09/20  9:42 PM  Result Value Ref Range   WBC 9.5 4.0 - 10.5 K/uL   RBC 4.61 4.22 - 5.81 MIL/uL   Hemoglobin 13.8 13.0 - 17.0 g/dL   HCT 35.4 65.6 - 81.2 %   MCV 88.5 80.0 - 100.0 fL   MCH 29.9 26.0 - 34.0 pg   MCHC 33.8 30.0 - 36.0 g/dL   RDW 75.1 70.0 - 17.4 %   Platelets 344 150 - 400 K/uL   nRBC 0.0 0.0 - 0.2 %    Comment: Performed at [facility], [address], [place], [place] [address]  Acetaminophen level     Status: Abnormal   Collection Time: 10/09/20  9:42 PM  Result Value Ref Range   Acetaminophen (Tylenol), Serum <10 (L) 10 - 30 ug/mL    Comment: (NOTE) Therapeutic concentrations vary significantly. A range of 10-30 ug/mL  may be an effective concentration for many patients. However, some  are best treated at concentrations outside of this range. Acetaminophen concentrations >150 ug/mL at 4 hours after ingestion  and >50 ug/mL at 12 hours after ingestion are often associated with  toxic reactions.  Performed at [facility], [address] [place], [place] [address]   Salicylate level  Status: Abnormal   Collection Time: 10/09/20  9:42 PM  Result Value Ref Range   Salicylate Lvl <7.0 (L) 7.0 - 30.0 mg/dL    Comment: Performed at [facility], [address] [place], [place] [address]  Resp Panel by RT-PCR (Flu A&B, Covid) Nasopharyngeal Swab     Status: None   Collection Time: 10/09/20 10:45 PM   Specimen: Nasopharyngeal Swab; Nasopharyngeal(NP) swabs in vial transport medium  Result Value Ref Range   SARS Coronavirus 2 by RT PCR NEGATIVE NEGATIVE    Comment: (NOTE) SARS-CoV-2 target nucleic acids are NOT DETECTED.  The SARS-CoV-2 RNA is generally detectable in upper respiratory specimens during the acute phase of infection. The lowest concentration of SARS-CoV-2 viral copies this assay can detect is 138 copies/mL. A negative result does not preclude SARS-Cov-2 infection and should not be used as the sole basis for treatment or other patient management decisions. A negative result may occur with  improper specimen collection/handling, submission of specimen other than nasopharyngeal swab, presence of viral mutation(s) within the areas targeted by this assay, and inadequate number of viral copies(<138 copies/mL). A negative result must be combined with clinical observations, patient history,  and epidemiological information. The expected result is Negative.  Fact Sheet for Patients:  BloggerCourse.comhttps://www.fda.gov/media/152166/download  Fact Sheet for Healthcare Providers:  SeriousBroker.ithttps://www.fda.gov/media/152162/download  This test is no t yet approved or cleared by the [place] FDA and  has been authorized for detection and/or diagnosis of SARS-CoV-2 by FDA under an Emergency Use Authorization (EUA). This EUA will remain  in effect (meaning this test can be used) for the duration of the COVID-19 declaration under Section 564(b)(1) of the Act, 21 U.S.C.section 360bbb-3(b)(1), unless the authorization is terminated  or revoked sooner.       Influenza A by PCR NEGATIVE NEGATIVE   Influenza B by PCR NEGATIVE NEGATIVE    Comment: (NOTE) The Xpert Xpress SARS-CoV-2/FLU/RSV plus assay is intended as an aid in the diagnosis of influenza from Nasopharyngeal swab specimens and should not be used as a sole basis for treatment. Nasal washings and aspirates are unacceptable for Xpert Xpress SARS-CoV-2/FLU/RSV testing.  Fact Sheet for Patients: BloggerCourse.comhttps://www.fda.gov/media/152166/download  Fact Sheet for Healthcare Providers: SeriousBroker.ithttps://www.fda.gov/media/152162/download  This test is not yet approved or cleared by the [place] FDA and has been authorized for detection and/or diagnosis of SARS-CoV-2 by FDA under an Emergency Use Authorization (EUA). This EUA will remain in effect (meaning this test can be used) for the duration of the COVID-19 declaration under Section 564(b)(1) of the Act, 21 U.S.C. section 360bbb-3(b)(1), unless the authorization is terminated or revoked.  Performed at [facility], [address] [place], [place] [address]     No current facility-administered medications for this encounter.   No current outpatient medications on file.    Musculoskeletal: Strength & Muscle Tone: within normal limits Gait & Station: normal Patient leans:  N/A  Psychiatric Specialty Exam:  Presentation  General Appearance: Appropriate for Environment; Casual  Eye Contact:Fair  Speech:Clear and Coherent  Speech Volume:Normal  Handedness:Right   Mood and Affect  Mood:Anxious  Affect:Appropriate   Thought Process  Thought Processes:Coherent  Descriptions of Associations:Intact  Orientation:Full (Time, Place and Person)  Thought Content:Delusions  History of Schizophrenia/Schizoaffective disorder:Yes  Duration of Psychotic Symptoms:Greater than six months  Hallucinations:Hallucinations: None  Ideas of Reference:Delusions; Paranoia; Percusatory  Suicidal Thoughts:Suicidal Thoughts: No  Homicidal Thoughts:Homicidal Thoughts: No   Sensorium  Memory:Remote Fair; Immediate Poor  Judgment:Impaired  Insight:Lacking   Art therapistxecutive Functions  Concentration:Fair  Attention Span:Fair  Recall:Fair  Fund of Knowledge:Fair  Language:Fair   Psychomotor Activity  Psychomotor Activity:Psychomotor Activity: Normal   Assets  Assets:Communication Skills; Physical Health   Sleep  Sleep:No data recorded  Physical Exam: Physical Exam Vitals and nursing note reviewed.  HENT:     Head: Normocephalic and atraumatic.     Nose: Nose normal.  Eyes:     Pupils: Pupils are equal, round, and reactive to light.  Pulmonary:     Effort: Pulmonary effort is normal.  Musculoskeletal:        General: Normal range of motion.     Cervical back: Normal range of motion.  Skin:    General: Skin is warm.  Neurological:     General: No focal deficit present.     Mental Status: He is alert and oriented to person, place, and time.  Psychiatric:        Attention and Perception: Attention normal.        Mood and Affect: Mood is anxious.        Speech: Speech normal.        Behavior: Behavior is cooperative.        Thought Content: Thought content is paranoid and delusional.        Cognition and Memory: Cognition is impaired.  Memory is impaired.        Judgment: Judgment is impulsive and inappropriate.    ROS Blood pressure (!) 150/83, pulse 73, temperature 99.1 F (37.3 C), temperature source Oral, resp. rate 16, height 5\' 11"  (1.803 m), weight 86.6 kg, SpO2 98 %. Body mass index is 26.64 kg/m.  Treatment Plan Summary: Daily contact with patient to assess and evaluate symptoms and progress in treatment, Medication management and Plan Inpatient hospitalization.  Start patient on antipsychotic at the EKG and labs.  Disposition: Recommend psychiatric Inpatient admission when medically cleared. Supportive therapy provided about ongoing stressors. Discussed crisis plan, support from social network, calling 911, coming to the Emergency Department, and calling Suicide Hotline.  , NP 10/10/2020 12:16 AM statement

## 2020-10-10 NOTE — ED Notes (Signed)
Shower offered patient declined  

## 2020-10-10 NOTE — ED Notes (Addendum)
Call to TTS, Brett Reid, for requesting medication regime for pt, psych to contact me back for same

## 2020-10-10 NOTE — BH Assessment (Signed)
This Clinical research associate contacted the following VA facilities due to patient's history of being treated at VA's in the past:  [facility] [phone] [phone]- AOD reports patient has not been treated at [facility] in a long time and [place] is currently on diversion  [place] [phone]- reports to be on capacity alert which also means diversion  [place] [phone]- AOD reports to be on diversion  [place] [phone]- AOD reports to be on diversion

## 2020-10-10 NOTE — ED Notes (Signed)
Pt asleep at this time, unable to collect vitals. Will collect pt vitals once awake. 

## 2020-10-10 NOTE — BH Assessment (Signed)
Writer followed up with referrals faxed to;    [facility] ([place]-[phone] -or- [phone]), Pending review   [facility] ([phone]---[phone]), unable to reach anyone. Information refaxed.   [facility] ([place]-[phone]), Pending review.   [facility] [phone] or [phone]), Unable to reach anyone. Information refaxed.   [facility] ([place]licia-[phone] or [phone]), No beds  Referral information faxed to;   [facility] [place] [phone]),   . [facility] [phone], [phone], [phone] or [phone]),   . [place] [phone] or [phone])  . [facility] [phone]).

## 2020-10-11 NOTE — ED Notes (Signed)
Pt given lunch tray.

## 2020-10-11 NOTE — ED Notes (Signed)
Breakfast given at this time.  

## 2020-10-11 NOTE — ED Notes (Signed)
Pt given shower supplies, in shower at this time. °

## 2020-10-11 NOTE — ED Notes (Signed)
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 were the pt would have the opportunity to speak to people "like [employer], or [place] or even [place], but I wouldn't ask for their autograph and that would demonstrate to a prospective girlfriend, of about my age, that I was ready for a relationship"  Pt with even speech rate and moderate tone but appears unable to explain the significance of these ideas, but pt is hyper-aware of the idea of being watched and judged by others for his actions, pt did report that he was anxious to be able to work again and wants Child psychotherapist to help in that regard, pt also requesting further evaluation from psych to determine if pt can leave, pt admits recently (within the last week) sleeping outside four or five nights but won't admit homelessness

## 2020-10-11 NOTE — ED Notes (Signed)
Hourly rounding reveals patient in room. No complaints, stable, in no acute distress. Q15 minute rounds and monitoring via Security Cameras to continue. 

## 2020-10-11 NOTE — ED Notes (Signed)
Report to include Situation, Background, Assessment, and Recommendations received from Sarah RN. Patient alert and oriented, warm and dry, in no acute distress. Patient denies SI, HI, AVH and pain. Patient made aware of Q15 minute rounds and security cameras for their safety. Patient instructed to come to me with needs or concerns.  

## 2020-10-11 NOTE — ED Notes (Signed)
Pt given dinner tray plus ice water.

## 2020-10-11 NOTE — BH Assessment (Signed)
Writer followed up with referrals faxed to;   [facility] ([phone]- [phone]), admission staff wasn't available. Took information and will call back.   [place] ([phone]---[phone]), unable to reach anyone.   [facility] (Jamie-[phone]), Information refaxed.   [place] ([place]-[phone] or [phone]), Pending review, to talk with their doctor. Writer followed up, but was unable to reach anyone.   [place] [phone]), Not accepting outside beds   [facility] ([place]-[phone]-or- [phone]), requested to refax with information with VA benefits   [facility] [phone], [phone], [phone] or [phone]), unable to reach anyone.   [place] ([phone] or [phone]), No beds   [facility] [phone]), left message for return phone call.

## 2020-10-11 NOTE — ED Notes (Signed)
Pt out of shower in rm at this time.

## 2020-10-11 NOTE — ED Provider Notes (Signed)
Emergency Medicine Observation Re-evaluation Note  Brett Reid is a 55 y.o. male, seen on rounds today.  Pt initially presented to the ED for complaints of anger issues, Delusional, and Schizophrenia Currently, the patient is sitting up, reports he slept well last night, currently awaiting breakfast.  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  Physical Exam  BP (!) 134/91 (BP Location: Right Arm)   Pulse 72   Temp (!) 97.4 F (36.3 C) (Oral)   Resp 18   Ht 5\' 11"  (1.803 m)   Wt 86.6 kg   SpO2 96%   BMI 26.64 kg/m  Physical Exam Constitutional:      Appearance: He is not ill-appearing or toxic-appearing.  HENT:     Head: Atraumatic.  Cardiovascular:     Comments: Well perfused Pulmonary:     Effort: Pulmonary effort is normal.  Abdominal:     General: There is no distension.  Musculoskeletal:        General: No deformity.  Skin:    Findings: No rash.  Neurological:     General: No focal deficit present.     Cranial Nerves: No cranial nerve deficit.      ED Course / MDM  EKG:   I have reviewed the labs performed to date as well as medications administered while in observation.  Recent changes in the last 24 hours include none.  Plan  Current plan is for inpatient psychiatric care. Patient is under full IVC at this time.     , MD 10/11/20 [phone]

## 2020-10-11 NOTE — ED Notes (Signed)
Pt provided snacks and drinks 

## 2020-10-12 ENCOUNTER — Other Ambulatory Visit: Payer: Self-pay

## 2020-10-12 ENCOUNTER — Inpatient Hospital Stay
Admission: RE | Admit: 2020-10-12 | Discharge: 2020-10-14 | DRG: 885 | Disposition: A | Discharge status: Home / Self Care | Payer: 59 | Source: Intra-hospital | Attending: Behavioral Health | Admitting: Behavioral Health

## 2020-10-12 ENCOUNTER — Encounter: Payer: Self-pay | Admitting: Psychiatry

## 2020-10-12 DIAGNOSIS — F22 Delusional disorders: Secondary | ICD-10-CM | POA: Diagnosis present

## 2020-10-12 DIAGNOSIS — Z5901 Sheltered homelessness: Secondary | ICD-10-CM

## 2020-10-12 DIAGNOSIS — R45850 Homicidal ideations: Secondary | ICD-10-CM | POA: Diagnosis present

## 2020-10-12 DIAGNOSIS — F200 Paranoid schizophrenia: Secondary | ICD-10-CM | POA: Insufficient documentation

## 2020-10-12 DIAGNOSIS — R45851 Suicidal ideations: Secondary | ICD-10-CM | POA: Diagnosis present

## 2020-10-12 DIAGNOSIS — I10 Essential (primary) hypertension: Secondary | ICD-10-CM | POA: Diagnosis present

## 2020-10-12 MED ORDER — ACETAMINOPHEN 325 MG PO TABS: 650.0000 mg | ORAL_TABLET | Freq: Four times a day (QID) | ORAL | Status: DC | PRN

## 2020-10-12 MED ORDER — ALUM & MAG HYDROXIDE-SIMETH 200-200-20 MG/5ML PO SUSP: 30.0000 mL | ORAL | Status: DC | PRN

## 2020-10-12 MED ORDER — MAGNESIUM HYDROXIDE 400 MG/5ML PO SUSP
30.0000 mL | Freq: Every day | ORAL | Status: DC | PRN
Start: 2020-10-12 — End: 2020-10-14

## 2020-10-12 MED ORDER — OLANZAPINE 5 MG PO TBDP
5.0000 mg | ORAL_TABLET | Freq: Every day | ORAL | Status: DC
  Administered 2020-10-12: 5 mg via ORAL
  Filled 2020-10-12: qty 1

## 2020-10-12 MED ORDER — HYDROXYZINE HCL 50 MG PO TABS
50.0000 mg | ORAL_TABLET | Freq: Three times a day (TID) | ORAL | Status: DC | PRN
Start: 2020-10-12 — End: 2020-10-14

## 2020-10-12 NOTE — ED Notes (Signed)
Pt taking shower, given new scrubs, socks, and underwear.

## 2020-10-12 NOTE — ED Notes (Signed)
Hourly rounding reveals patient in room. No complaints, stable, in no acute distress. Q15 minute rounds and monitoring via Security Cameras to continue. 

## 2020-10-12 NOTE — ED Provider Notes (Signed)
Emergency Medicine Observation Re-evaluation Note  Brett Reid is a 55 y.o. male, seen on rounds today.  Pt initially presented to the ED for complaints of anger issues, Delusional, and Schizophrenia Currently, the patient is resting, no acute complaints.  Physical Exam  BP (!) 130/96 (BP Location: Right Arm)   Pulse 73   Temp 98.2 F (36.8 C) (Oral)   Resp 18   Ht 5\' 11"  (1.803 m)   Wt 86.6 kg   SpO2 97%   BMI 26.64 kg/m  Physical Exam General: no acute distress Cardiac: normal rate Lungs: equal chest rise Psych: calm  ED Course / MDM  EKG:   I have reviewed the labs performed to date as well as medications administered while in observation.  Recent changes in the last 24 hours include none.  Plan  Current plan is for admission, pending acceptance. Patient is under full IVC at this time.   , MD 10/12/20 [phone]

## 2020-10-12 NOTE — ED Notes (Signed)
IVC/ Pending Admit to BMU  

## 2020-10-12 NOTE — ED Notes (Signed)
Dinner tray provided

## 2020-10-12 NOTE — Tx Team (Signed)
Initial Treatment Plan 10/12/2020 8:55 PM Brett Reid [id number]    PATIENT STRESSORS: Marital or family conflict Occupational concerns   PATIENT STRENGTHS: Ability for insight Supportive family/friends   PATIENT IDENTIFIED PROBLEMS: Anger  Depression                   DISCHARGE CRITERIA:  Improved stabilization in mood, thinking, and/or behavior Verbal commitment to aftercare and medication compliance  PRELIMINARY DISCHARGE PLAN: Outpatient therapy Return to previous living arrangement  PATIENT/FAMILY INVOLVEMENT: This treatment plan has been presented to and reviewed with the patient, Brett Reid. The patient has been given the opportunity to ask questions and make suggestions.  Elmyra Ricks, RN 10/12/2020, 8:55 PM

## 2020-10-12 NOTE — ED Notes (Signed)
Pt given lunch tray.

## 2020-10-12 NOTE — ED Notes (Signed)
Pt given breakfast tray

## 2020-10-12 NOTE — BH Assessment (Addendum)
Per Dr. Ericka Pontiff pt meets criteria for INPT.  Pt is currently under review with [facility] BMU.

## 2020-10-12 NOTE — ED Notes (Signed)
VS not taken patient asleep 

## 2020-10-12 NOTE — ED Notes (Signed)
Pt out of shower.

## 2020-10-12 NOTE — Plan of Care (Signed)
Patient new to the unit tonight, hasn't had time to progress ° °Problem: Education: °Goal: Knowledge of [facility] Education information/materials will improve °Outcome: Not Progressing °Goal: Emotional status will improve °Outcome: Not Progressing °Goal: Mental status will improve °Outcome: Not Progressing °Goal: Verbalization of understanding the information provided will improve °Outcome: Not Progressing °  °Problem: Safety: °Goal: Periods of time without injury will increase °Outcome: Not Progressing °  °Problem: Education: °Goal: Will be free of psychotic symptoms °Outcome: Not Progressing °Goal: Knowledge of the prescribed therapeutic regimen will improve °Outcome: Not Progressing °  °Problem: Self-Care: °Goal: Ability to participate in self-care as condition permits will improve °Outcome: Not Progressing °  °

## 2020-10-12 NOTE — Consult Note (Signed)
[facility] Face-to-Face Psychiatry Consult   Reason for Consult: Consult for 55 year old man brought to the hospital after making suicidal statements Referring Physician: Katrinka Blazing Patient Identification: Brett Reid MRN:  [id number] Principal Diagnosis: Paranoid schizophrenia [facility]) Diagnosis:  Principal Problem:   Paranoid schizophrenia (HCC)   Total Time spent with patient: 1 hour  Subjective:   Brett Reid is a 55 y.o. male patient admitted with "I am having trouble getting a job".  HPI: 55 year old man brought in to the hospital and placed under IVC.  Apparently he went to an [employer] and told the cashier that he was having suicidal thoughts.  She called authorities who escorted him peaceably to the hospital.  Patient is a very rambling historian.  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  He has no evidence whatsoever but is firmly convinced of this.  He also has equally obviously delusional believes that women do not want to be with him because they have heard rumors that Once upon a time again over 10 years ago he was unable to ejaculate and therefore are biased against him.  He presents all of this with intensity but a fairly flat affect.  Admits that he said he was suicidal last night.  Does not have any real explanation for it.  Also says he occasionally has homicidal thoughts about these ex landlords but swears that he would never act on it and has no history of violence.  Patient is evidently not receiving any psychiatric treatment and has no insight at all.  He is currently homeless and said he has been staying on the street for the last few days because he discovered he could not get into the [facility] shelter.  He came up from [place] where he had been living in shelters.  He says it is been years since he had a stable place to live.   Does not get any kind of financial support apparently.  Told me he does not really have any relatives he keeps up with except for some siblings in [place].  Not currently threatening but really seems to be consumed by delusions.  Past Psychiatric History: Patient is not a reliable historian on several fronts.  The old chart pretty clearly indicates that he has been seen at the [facility] system but that it is been back in the 90s and mid 2000's.  Documented that he has a history of psychotic symptoms.  Patient swears to me that he has never been a member of the [employer] whatsoever and has no connection whatsoever to [facility] hospitals.  Tells me he has talked to counselors in the past but swears that no one has ever told him he had a psychiatric illness or suggested medicine.  Denies any history of suicide attempts or violence.  Risk to Self:   Risk to Others:   Prior Inpatient Therapy:   Prior Outpatient Therapy:    Past Medical History: History reviewed. No pertinent past medical history. History reviewed. No pertinent surgical history. Family History: History reviewed. No pertinent family history. Family Psychiatric  History: Denies any Social History:  Social History   Substance and Sexual Activity  Alcohol Use Not Currently     Social History   Substance and Sexual Activity  Drug Use Not Currently    Social History   Socioeconomic History  .  Marital status: Single    Spouse name: Not on file  . Number of children: Not on file  . Years of education: Not on file  . Highest education level: Not on file  Occupational History  . Not on file  Tobacco Use  . Smoking status: Former Games developer  . Smokeless tobacco: Never Used  Substance and Sexual Activity  . Alcohol use: Not Currently  . Drug use: Not Currently  . Sexual activity: Not on file  Other Topics Concern  . Not on file  Social History Narrative  . Not on file   Social Determinants of Health   Financial Resource Strain:  Not on file  Food Insecurity: Not on file  Transportation Needs: Not on file  Physical Activity: Not on file  Stress: Not on file  Social Connections: Not on file   Additional Social History:    Allergies:   Allergies  Allergen Reactions  . Haloperidol     Other reaction(s): NEAR SYNCOPE    Labs: No results found for this or any previous visit (from the past 48 hour(s)).  Current Facility-Administered Medications  Medication Dose Route Frequency Provider Last Rate Last Admin  . LORazepam (ATIVAN) tablet 1 mg  1 mg Oral Q6H PRN Jearld Lesch, NP      . OLANZapine zydis (ZYPREXA) disintegrating tablet 5 mg  5 mg Oral QHS Dixon, Rashaun M, NP   5 mg at 10/11/20 2130   No current outpatient medications on file.    Musculoskeletal: Strength & Muscle Tone: within normal limits Gait & Station: normal Patient leans: N/A            Psychiatric Specialty Exam:  Presentation  General Appearance: Appropriate for Environment; Casual  Eye Contact:Fair  Speech:Clear and Coherent  Speech Volume:Normal  Handedness:Right   Mood and Affect  Mood:Anxious  Affect:Appropriate   Thought Process  Thought Processes:Coherent  Descriptions of Associations:Intact  Orientation:Full (Time, Place and Person)  Thought Content:Delusions  History of Schizophrenia/Schizoaffective disorder:Yes  Duration of Psychotic Symptoms:Greater than six months  Hallucinations:No data recorded Ideas of Reference:Delusions; Paranoia; Percusatory  Suicidal Thoughts:No data recorded Homicidal Thoughts:No data recorded  Sensorium  Memory:Remote Fair; Immediate Poor  Judgment:Impaired  Insight:Lacking   Executive Functions  Concentration:Fair  Attention Span:Fair  Recall:Fair  Fund of Knowledge:Fair  Language:Fair   Psychomotor Activity  Psychomotor Activity:No data recorded  Assets  Assets:Communication Skills; Physical Health   Sleep  Sleep:No data  recorded  Physical Exam: Physical Exam Vitals and nursing note reviewed.  Constitutional:      Appearance: Normal appearance.  HENT:     Head: Normocephalic and atraumatic.     Mouth/Throat:     Pharynx: Oropharynx is clear.  Eyes:     Pupils: Pupils are equal, round, and reactive to light.  Cardiovascular:     Rate and Rhythm: Normal rate and regular rhythm.  Pulmonary:     Effort: Pulmonary effort is normal.     Breath sounds: Normal breath sounds.  Abdominal:     General: Abdomen is flat.     Palpations: Abdomen is soft.  Musculoskeletal:        General: Normal range of motion.  Skin:    General: Skin is warm and dry.  Neurological:     General: No focal deficit present.     Mental Status: He is alert. Mental status is at baseline.  Psychiatric:        Attention and Perception: He is inattentive.  Mood and Affect: Mood is anxious.        Speech: Speech is tangential.        Behavior: Behavior is not aggressive or hyperactive.        Thought Content: Thought content is paranoid and delusional. Thought content includes suicidal ideation. Thought content does not include suicidal plan.        Cognition and Memory: Memory is impaired.        Judgment: Judgment is inappropriate.    Review of Systems  Constitutional: Negative.   HENT: Negative.   Eyes: Negative.   Respiratory: Negative.   Cardiovascular: Negative.   Gastrointestinal: Negative.   Musculoskeletal: Negative.   Skin: Negative.   Neurological: Negative.   Psychiatric/Behavioral: Positive for suicidal ideas. The patient is nervous/anxious.    Blood pressure (!) 137/93, pulse 72, temperature (!) 97.4 F (36.3 C), temperature source Oral, resp. rate 18, height 5\' 11"  (1.803 m), weight 86.6 kg, SpO2 96 %. Body mass index is 26.64 kg/m.  Treatment Plan Summary: Plan [place] gentleman who seems to very clearly be suffering from paranoid delusions that are preventing him from living normally but who has  poor insight.  Made suicidal comments yesterday unclear really whether this was serious or whether it was because of the cold weather or what.  He is unreliable in several ways.  He is homeless has no support no place to stay.  Given his psychosis it seems the best thing to do is uphold the commitment and admit him to the psychiatric service.  Orders will be placed.  Case reviewed with TTS and inpatient team.  Disposition: Recommend psychiatric Inpatient admission when medically cleared. Supportive therapy provided about ongoing stressors.  , MD 10/12/2020 11:56 AM

## 2020-10-12 NOTE — BH Assessment (Signed)
Patient can come down after 8pm  Call to give report: [phone]  Patient is to be admitted to [facility] by Dr. Neale Burly  Attending Physician will be. Dr. Neale Burly   Patient has been assigned to room (TBA), by [facility] Charge Nurse Maryelizabeth Kaufmann, RN.   Intake Paper Work has been signed and placed on patient chart.  ER staff is aware of the admission: 1. Rivka Barbara, ER Secretary  2. Quale, ER MD  3. Jennifer,Patient's Nurse  4. Ethelene Browns, Patient Access.

## 2020-10-12 NOTE — Progress Notes (Signed)
Patient admitted from [facility], report recieved from [place], [place]. Patient calm and cooperative during assessment. Patient presents preoccupied and paranoid stating, "My former landlords are stopping me from getting a job." Patient stated he came in because of his anger issues and he wants out patient therapy for that. Patient denies SI/HI/AVH. Patient oriented to the unit and his room. Patient given education, support, and encouragement to be active in his treatment plan. Patient skin assessment completed with Cleo, Pt has surgical scars bilateral shoulders from previous surgery. No other abnormalities or contraband found on patient. Patient being monitored Q 15 minutes for safety per unit protocol. Pt remains safe on the unit

## 2020-10-13 DIAGNOSIS — F200 Paranoid schizophrenia: Secondary | ICD-10-CM

## 2020-10-13 DIAGNOSIS — Z800 Family history of malignant neoplasm of digestive organs: Secondary | ICD-10-CM | POA: Insufficient documentation

## 2020-10-13 DIAGNOSIS — G4700 Insomnia, unspecified: Secondary | ICD-10-CM | POA: Insufficient documentation

## 2020-10-13 LAB — LIPID PANEL
Cholesterol: 171 mg/dL (ref 0–200)
HDL: 48 mg/dL (ref >40)
LDL Cholesterol: 83 mg/dL (ref 0–99)
Total CHOL/HDL Ratio: 3.6 RATIO
Triglycerides: 201 mg/dL — ABNORMAL HIGH (ref <150)
VLDL: 40 mg/dL (ref 0–40)

## 2020-10-13 LAB — HEMOGLOBIN A1C
Hgb A1c MFr Bld: 5.6 % (ref 4.8–5.6)
Mean Plasma Glucose: 114.02 mg/dL

## 2020-10-13 MED ORDER — BENZOCAINE 10 % MT GEL
Freq: Three times a day (TID) | OROMUCOSAL | Status: DC | PRN
  Filled 2020-10-13: qty 9

## 2020-10-13 MED ORDER — OLANZAPINE 5 MG PO TBDP
10.0000 mg | ORAL_TABLET | Freq: Every day | ORAL | Status: DC
  Administered 2020-10-13: 10 mg via ORAL
  Filled 2020-10-13: qty 2

## 2020-10-13 NOTE — Progress Notes (Signed)
Recreation Therapy Notes  INPATIENT RECREATION TR PLAN  Patient Details Name: Brett Reid MRN: [id number] DOB: 03/24/1966 Today's Date: 10/13/2020  Rec Therapy Plan Is patient appropriate for Therapeutic Recreation?: Yes Treatment times per week: at least 3 Estimated Length of Stay: 5-7 days TR Treatment/Interventions: Group participation (Comment)  Discharge Criteria Pt will be discharged from therapy if:: Discharged Treatment plan/goals/alternatives discussed and agreed upon by:: Patient/family  Discharge Summary     [name] 10/13/2020, 3:52 PM

## 2020-10-13 NOTE — [facility] Suicide Risk Assessment (Signed)
[facility] Admission Suicide Risk Assessment   Nursing information obtained from:  Patient Demographic factors:  NA Current Mental Status:  NA Loss Factors:  NA Historical Factors:  NA Risk Reduction Factors:  NA  Total Time spent with patient: 1 hour Principal Problem: Schizophrenia, paranoid (HCC) Diagnosis:  Principal Problem:   Schizophrenia, paranoid (HCC)  Subjective Data: 55 year old male brought to the hospital under IVC after he informed a cashier that he was having suicidal thoughts. While in the emergency room, and on exam today he remains fixated on a delusion that his landlords from over 10 years ago are conspiring against him to prevent him from becoming gainfully employed. His thoughts are tangential and he also discusses a news station in [place] conspiring with landlords to bias people against him in the community. He feels that these landlords are hacking job networks and computers to see where he has applied so they can call and get them not to hire him. He brings up some photos on job websites that looks like the men he rented from as evidence. He has been homeless for roughly 10 years going from one shelter to the next across the state. He is resistant against going to a long term shelter such as [facility] versus [employer] as he is not allowed to be employed during the first month. He goes on to say he has two different 2-year degrees and is upset that he may never get to work in his field. He states he occasionally has thoughts about killing his old landlords, but swears he would never act on these thoughts. He has no history of violence. No suicidal thoughts since prior to admission. Denies auditory and visual hallucinations.   Continued Clinical Symptoms:  Alcohol Use Disorder Identification Test Final Score (AUDIT): 0 The "Alcohol Use Disorders Identification Test", Guidelines for Use in Primary Care, Second Edition.  World Science writer  [facility]). Score between 0-7:  no or low risk or alcohol related problems. Score between 8-15:  moderate risk of alcohol related problems. Score between 16-19:  high risk of alcohol related problems. Score 20 or above:  warrants further diagnostic evaluation for alcohol dependence and treatment.   CLINICAL FACTORS:   Schizophrenia:   Paranoid or undifferentiated type Previous Psychiatric Diagnoses and Treatments   Musculoskeletal: Strength & Muscle Tone: within normal limits Gait & Station: normal Patient leans: N/A  Psychiatric Specialty Exam:  Presentation  General Appearance: Fairly Groomed  Eye Contact:Fair  Speech:Pressured  Speech Volume:Normal  Handedness:Right   Mood and Affect  Mood:Anxious  Affect:Congruent   Thought Process  Thought Processes:Goal Directed  Descriptions of Associations:Tangential  Orientation:Full (Time, Place and Person)  Thought Content:Delusions; Rumination  History of Schizophrenia/Schizoaffective disorder:Yes  Duration of Psychotic Symptoms:Greater than six months  Hallucinations:Hallucinations: None  Ideas of Reference:Delusions; Paranoia; Percusatory  Suicidal Thoughts:Suicidal Thoughts: No  Homicidal Thoughts:Homicidal Thoughts: No   Sensorium  Memory:Immediate Fair; Recent Fair  Judgment:Impaired  Insight:Lacking   Executive Functions  Concentration:Fair  Attention Span:Fair  Recall:Fair  Fund of Knowledge:Fair  Language:Fair   Psychomotor Activity  Psychomotor Activity:Psychomotor Activity: Restlessness   Assets  Assets:Desire for Improvement; Physical Health; Resilience; Talents/Skills; Vocational/Educational   Sleep  Sleep:Sleep: Fair Number of Hours of Sleep: 7.15    Physical Exam: Physical Exam ROS Blood pressure (!) 133/95, pulse 68, temperature 98 F (36.7 C), temperature source Oral, resp. rate 17, height 5\' 11"  (1.803 m), weight 86.6 kg, SpO2 100 %. Body mass index is 26.64  kg/m.  COGNITIVE FEATURES THAT CONTRIBUTE TO RISK:  Loss of executive function    SUICIDE RISK:   Mild:  Suicidal ideation of limited frequency, intensity, duration, and specificity.  There are no identifiable plans, no associated intent, mild dysphoria and related symptoms, good self-control (both objective and subjective assessment), few other risk factors, and identifiable protective factors, including available and accessible social support.  PLAN OF CARE: Continue inpatient admission, see H&P for full details.   I certify that inpatient services furnished can reasonably be expected to improve the patient's condition.   Jesse Sans, MD 10/13/2020, 3:08 PM

## 2020-10-13 NOTE — Progress Notes (Signed)
Patient calm and cooperative during assessment denying SI/HI/AVH. Patient observed interacting appropriately with staff and peers on the unit. Patient compliant with medication administration per MD orders. Pt given education, support, and encouragement to be active in his treatment plan. Patient being monitored Q 15 minutes for safety per unit protocol. Pt remains safe on the unit.  

## 2020-10-13 NOTE — [facility] Suicide Risk Assessment (Signed)
[facility] INPATIENT:  Family/Significant Other Suicide Prevention Education  Suicide Prevention Education:  Patient Refusal for Family/Significant Other Suicide Prevention Education: The patient Brett Reid has refused to provide written consent for family/significant other to be provided Family/Significant Other Suicide Prevention Education during admission and/or prior to discharge.  Physician notified. SPE completed with pt, as pt refused to consent to family contact. SPI pamphlet provided to pt and pt was encouraged to share information with support network, ask questions, and talk about any concerns relating to SPE. Pt denies access to guns/firearms and verbalized understanding of information provided. Mobile Crisis information also provided to pt.    Harden Mo 10/13/2020, 11:57 AM

## 2020-10-13 NOTE — Progress Notes (Signed)
Recreation Therapy Notes  INPATIENT RECREATION THERAPY ASSESSMENT  Patient Details Name: GRIER VU MRN: [id number] DOB: 04/03/1966 Today's Date: 10/13/2020       Information Obtained From: Patient  Able to Participate in Assessment/Interview: Yes  Patient Presentation: Responsive  Reason for Admission (Per Patient): Active Symptoms  Patient Stressors: Work  Pharmacologist:    (None)  Leisure Interests (2+):   (Applying for jobs)  Frequency of Recreation/Participation: Marketing executive Resources:  Yes  Community Resources:  Library  Current Use:    If no, Barriers?: Transportation  Expressed Interest in [employer] Information: Yes  [employer] of Residence:  Film/video editor  Patient Main Form of Transportation: Walk  Patient Strengths:  N/A  Patient Identified Areas of Improvement:  Getting a job  Patient Goal for Hospitalization:  Getting out  Current SI (including self-harm):  No  Current HI:  No  Current AVH: No  Staff Intervention Plan: Group Attendance,Collaborate with Interdisciplinary Treatment Team  Consent to Intern Participation: N/A  Stefon Ramthun 10/13/2020, 3:39 PM

## 2020-10-13 NOTE — Plan of Care (Signed)
Pt denies depression, anxiety, SI, HI and AVH. Pt was educated on care plan and verbalizes understanding. Torrie Mayers RN Problem: Education: Goal: Knowledge of [facility] Education information/materials will improve Outcome: Progressing Goal: Emotional status will improve Outcome: Progressing Goal: Mental status will improve Outcome: Progressing Goal: Verbalization of understanding the information provided will improve Outcome: Progressing   Problem: Safety: Goal: Periods of time without injury will increase Outcome: Progressing   Problem: Education: Goal: Will be free of psychotic symptoms Outcome: Progressing Goal: Knowledge of the prescribed therapeutic regimen will improve Outcome: Progressing   Problem: Self-Care: Goal: Ability to participate in self-care as condition permits will improve Outcome: Progressing
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
## 2020-10-13 NOTE — H&P (Signed)
Psychiatric Admission Assessment Adult  Patient Identification: Brett Reid MRN:  [id number] Date of Evaluation:  10/13/2020 Chief Complaint:  Schizophrenia, paranoid (HCC) [F20.0] Principal Diagnosis: Schizophrenia, paranoid (HCC) Diagnosis:  Principal Problem:   Schizophrenia, paranoid (HCC)  CC "I need a job"  History of Present Illness: 55 year old male brought to the hospital under IVC after he informed a Conservation officer, nature that he was having suicidal thoughts. While in the emergency room, and on exam today he remains fixated on a delusion that his landlords from over 10 years ago are conspiring against him to prevent him from becoming gainfully employed. His thoughts are tangential and he also discusses a news station in [place] conspiring with landlords to bias people against him in the community. He feels that these landlords are hacking job networks and computers to see where he has applied so they can call and get them not to hire him. He brings up some photos on job websites that looks like the men he rented from as evidence. He has been homeless for roughly 10 years going from one shelter to the next across the state. He is resistant against going to a long term shelter such as [facility] versus [employer] as he is not allowed to be employed during the first month. He goes on to say he has two different 2-year degrees and is upset that he may never get to work in his field. He states he occasionally has thoughts about killing his old landlords, but swears he would never act on these thoughts. He has no history of violence. No suicidal thoughts since prior to admission. Denies auditory and visual hallucinations.  Associated Signs/Symptoms: Depression Symptoms:  depressed mood, hopelessness, recurrent thoughts of death, Duration of Depression Symptoms: No data recorded (Hypo) Manic Symptoms:  Impulsivity, Anxiety Symptoms:  Excessive Worry, Psychotic Symptoms:   Delusions, PTSD Symptoms: Negative Total Time spent with patient: 1 hour  Past Psychiatric History: Unreliable historian. States he does not have a psychiatric illness, and denies being on past medications. He does indicate that a therapist would be helpful. Chart indicates he has been [facility] in [facility] in 90s and mid 200s for psychotic symptoms.   Is the patient at risk to self? Yes.    Has the patient been a risk to self in the past 6 months? No.  Has the patient been a risk to self within the distant past? No.  Is the patient a risk to others? No.  Has the patient been a risk to others in the past 6 months? No.  Has the patient been a risk to others within the distant past? No.   Prior Inpatient Therapy:   Prior Outpatient Therapy:    Alcohol Screening: 1. How often do you have a drink containing alcohol?: Never 2. How many drinks containing alcohol do you have on a typical day when you are drinking?: 1 or 2 3. How often do you have six or more drinks on one occasion?: Never AUDIT-C Score: 0 4. How often during the last year have you found that you were not able to stop drinking once you had started?: Never 5. How often during the last year have you failed to do what was normally expected from you because of drinking?: Never 6. How often during the last year have you needed a first drink in the morning to get yourself going after a heavy drinking session?: Never 7. How often during the last year have you had  a feeling of guilt of remorse after drinking?: Never 8. How often during the last year have you been unable to remember what happened the night before because you had been drinking?: Never 9. Have you or someone else been injured as a result of your drinking?: No 10. Has a relative or friend or a doctor or another health worker been concerned about your drinking or suggested you cut down?: No Alcohol Use Disorder Identification Test Final Score (AUDIT): 0 Alcohol  Brief Interventions/Follow-up: AUDIT Score <7 follow-up not indicated Substance Abuse History in the last 12 months:  No. Consequences of Substance Abuse: Negative Previous Psychotropic Medications: Yes  Psychological Evaluations: Yes  Past Medical History: History reviewed. No pertinent past medical history. History reviewed. No pertinent surgical history. Family History: History reviewed. No pertinent family history. Family Psychiatric  History: Denies Tobacco Screening: Have you used any form of tobacco in the last 30 days? (Cigarettes, Smokeless Tobacco, Cigars, and/or Pipes): No Social History:  Social History   Substance and Sexual Activity  Alcohol Use Not Currently     Social History   Substance and Sexual Activity  Drug Use Not Currently    Additional Social History: Marital status: Single Does patient have children?: No       Allergies:   Allergies  Allergen Reactions  . Haloperidol     Other reaction(s): NEAR SYNCOPE   Lab Results:  Results for orders placed or performed during the hospital encounter of 10/12/20 (from the past 48 hour(s))  Hemoglobin A1c     Status: None   Collection Time: 10/13/20  6:45 AM  Result Value Ref Range   Hgb A1c MFr Bld 5.6 4.8 - 5.6 %    Comment: (NOTE) Pre diabetes:          5.7%-6.4%  Diabetes:              >6.4%  Glycemic control for   <7.0% adults with diabetes    Mean Plasma Glucose 114.02 mg/dL    Comment: Performed at [facility], [address], [place], [place] [address]  Lipid panel     Status: Abnormal   Collection Time: 10/13/20  6:45 AM  Result Value Ref Range   Cholesterol 171 0 - 200 mg/dL   Triglycerides 191 (H) <150 mg/dL   HDL 48 >47 mg/dL   Total CHOL/HDL Ratio 3.6 RATIO   VLDL 40 0 - 40 mg/dL   LDL Cholesterol 83 0 - 99 mg/dL    Comment:        Total Cholesterol/HDL:CHD Risk Coronary Heart Disease Risk Table                     Men   Women  1/2 Average Risk   3.4   3.3  Average Risk        5.0   4.4  2 X Average Risk   9.6   7.1  3 X Average Risk  23.4   11.0        Use the calculated Patient Ratio above and the CHD Risk Table to determine the patient's CHD Risk.        ATP III CLASSIFICATION (LDL):  <100     mg/dL   Optimal  829-562  mg/dL   Near or Above                    Optimal  130-159  mg/dL   Borderline  130-865  mg/dL  High  >190     mg/dL   Very High Performed at [facility], [address] [place], [place] [address]     Blood Alcohol level:  Lab Results  Component Value Date   [facility] <10 10/09/2020    Metabolic Disorder Labs:  Lab Results  Component Value Date   HGBA1C 5.6 10/13/2020   MPG 114.02 10/13/2020   No results found for: PROLACTIN Lab Results  Component Value Date   CHOL 171 10/13/2020   TRIG 201 (H) 10/13/2020   HDL 48 10/13/2020   CHOLHDL 3.6 10/13/2020   VLDL 40 10/13/2020   LDLCALC 83 10/13/2020    Current Medications: Current Facility-Administered Medications  Medication Dose Route Frequency Provider Last Rate Last Admin  . acetaminophen (TYLENOL) tablet 650 mg  650 mg Oral Q6H PRN Clapacs, John T, MD      . alum & mag hydroxide-simeth (MAALOX/MYLANTA) 200-200-20 MG/5ML suspension 30 mL  30 mL Oral Q4H PRN Clapacs, John T, MD      . benzocaine (ORAJEL) 10 % mucosal gel   Mouth/Throat TID PRN Jesse Sans, MD      . hydrOXYzine (ATARAX/VISTARIL) tablet 50 mg  50 mg Oral TID PRN Clapacs, John T, MD      . magnesium hydroxide (MILK OF MAGNESIA) suspension 30 mL  30 mL Oral Daily PRN Clapacs, John T, MD      . OLANZapine zydis (ZYPREXA) disintegrating tablet 10 mg  10 mg Oral QHS Jesse Sans, MD       PTA Medications: No medications prior to admission.    Musculoskeletal: Strength & Muscle Tone: within normal limits Gait & Station: normal Patient leans: N/A    Psychiatric Specialty Exam:  Presentation  General Appearance: Fairly Groomed  Eye Contact:Fair  Speech:Pressured  Speech  Volume:Normal  Handedness:Right   Mood and Affect  Mood:Anxious  Affect:Congruent   Thought Process  Thought Processes:Goal Directed  Duration of Psychotic Symptoms: Greater than six months  Past Diagnosis of Schizophrenia or Psychoactive disorder: Yes  Descriptions of Associations:Tangential  Orientation:Full (Time, Place and Person)  Thought Content:Delusions; Rumination  Hallucinations:Hallucinations: None  Ideas of Reference:Delusions; Paranoia; Percusatory  Suicidal Thoughts:Suicidal Thoughts: No  Homicidal Thoughts:Homicidal Thoughts: No   Sensorium  Memory:Immediate Fair; Recent Fair  Judgment:Impaired  Insight:Lacking   Executive Functions  Concentration:Fair  Attention Span:Fair  Recall:Fair  Fund of Knowledge:Fair  Language:Fair   Psychomotor Activity  Psychomotor Activity:Psychomotor Activity: Restlessness   Assets  Assets:Desire for Improvement; Physical Health; Resilience; Talents/Skills; Vocational/Educational   Sleep  Sleep:Sleep: Fair Number of Hours of Sleep: 7.15    Physical Exam: Physical Exam Vitals and nursing note reviewed.  Constitutional:      Appearance: Normal appearance.  HENT:     Head: Normocephalic and atraumatic.     Right Ear: External ear normal.     Left Ear: External ear normal.     Nose: Nose normal.     Mouth/Throat:     Mouth: Mucous membranes are moist.     Pharynx: Oropharynx is clear.  Eyes:     Extraocular Movements: Extraocular movements intact.     Conjunctiva/sclera: Conjunctivae normal.     Pupils: Pupils are equal, round, and reactive to light.  Cardiovascular:     Rate and Rhythm: Normal rate.     Pulses: Normal pulses.  Pulmonary:     Effort: Pulmonary effort is normal.     Breath sounds: Normal breath sounds.  Abdominal:     General: Abdomen  is flat.     Palpations: Abdomen is soft.  Musculoskeletal:        General: No swelling. Normal range of motion.     Cervical back:  Normal range of motion and neck supple.  Skin:    General: Skin is warm and dry.  Neurological:     General: No focal deficit present.     Mental Status: He is alert and oriented to person, place, and time.  Psychiatric:        Attention and Perception: Attention and perception normal.        Mood and Affect: Mood is anxious.        Speech: Speech is rapid and pressured.        Behavior: Behavior is cooperative.        Thought Content: Thought content is paranoid and delusional.        Cognition and Memory: Memory normal. Cognition is impaired.        Judgment: Judgment is inappropriate.    Review of Systems  Constitutional: Positive for malaise/fatigue. Negative for fever.  HENT: Negative for congestion and sore throat.   Eyes: Negative for blurred vision and double vision.  Respiratory: Negative for sputum production and shortness of breath.   Cardiovascular: Negative for chest pain and palpitations.  Gastrointestinal: Negative for constipation, diarrhea, nausea and vomiting.  Genitourinary: Negative for dysuria and urgency.  Musculoskeletal: Negative for back pain and myalgias.  Skin: Negative for itching and rash.  Neurological: Negative for dizziness and headaches.  Endo/Heme/Allergies: Positive for environmental allergies. Does not bruise/bleed easily.  Psychiatric/Behavioral: The patient is nervous/anxious.    Blood pressure (!) 133/95, pulse 68, temperature 98 F (36.7 C), temperature source Oral, resp. rate 17, height 5\' 11"  (1.803 m), weight 86.6 kg, SpO2 100 %. Body mass index is 26.64 kg/m.  Treatment Plan Summary: Daily contact with patient to assess and evaluate symptoms and progress in treatment and Medication management. 55 year old male with history of schizophrenia presenting with paranoia and fixed delusions about his ex-landlords preventing him from becoming employed. Untreated for several years. Increase Olanzapine to 10 mg QHS.   Observation  Level/Precautions:  15 minute checks  Laboratory:  Completed in ED  Psychotherapy:    Medications:    Consultations:    Discharge Concerns:    Estimated LOS:  Other:     Physician Treatment Plan for Primary Diagnosis: Schizophrenia, paranoid (HCC) Long Term Goal(s): Improvement in symptoms so as ready for discharge  Short Term Goals: Ability to identify changes in lifestyle to reduce recurrence of condition will improve, Ability to verbalize feelings will improve, Ability to disclose and discuss suicidal ideas, Ability to demonstrate self-control will improve, Ability to identify and develop effective coping behaviors will improve, Ability to maintain clinical measurements within normal limits will improve and Compliance with prescribed medications will improve  Physician Treatment Plan for Secondary Diagnosis: Principal Problem:   Schizophrenia, paranoid (HCC)  Long Term Goal(s): Improvement in symptoms so as ready for discharge  Short Term Goals: Ability to identify changes in lifestyle to reduce recurrence of condition will improve, Ability to verbalize feelings will improve, Ability to disclose and discuss suicidal ideas, Ability to demonstrate self-control will improve, Ability to identify and develop effective coping behaviors will improve, Ability to maintain clinical measurements within normal limits will improve and Compliance with prescribed medications will improve  I certify that inpatient services furnished can reasonably be expected to improve the patient's condition.    56, MD  3/29/20222:53 PM

## 2020-10-13 NOTE — [facility] Group Notes (Signed)
LCSW Group Therapy Note       10/13/2020 2:19 PM      Type of Therapy/Topic:  Group Therapy: Emotional Regulation        Participation Level:  Active       Description of Group:     Milieu was in state of chaos. Group was held outside to utilize coping skills to help deal with strong emotions. Positive recreational activity and mindfulness were practiced to assist with distraction/amelioration of negative emotional outbursts and overload.        Therapeutic Goals:   1. Patient will identify positive activities to help distract/ameliorate negative emotions.   2. Patient will demonstrate positive leisure activity/mindfulness practice.       Summary of Patient Progress:   Patient was present for the majority of the group. She participated in basketball activity.        Therapeutic Modalities:   Mindfulness   Positive leisure      Gabryel Files [place], MSW, LCSW-A 3/29/20222:20 PM

## 2020-10-13 NOTE — Plan of Care (Signed)
Patient stated to this writer that he is feeling better since he got here and is hopeful he will be leaving soon   Problem: Education: Goal: Emotional status will improve Outcome: Progressing Goal: Mental status will improve Outcome: Progressing

## 2020-10-13 NOTE — Progress Notes (Signed)
Recreation Therapy Notes  Date: 10/13/2020  Time: 2:30pm   Location: Craft room   Behavioral response: Appropriate  Intervention Topic: Self-care    Discussion/Intervention:  Group content today was focused on Self-Care. The group defined self-care and some positive ways they care for themselves. Individuals expressed ways and reasons why they neglected any self-care in the past. Patients described ways to improve self-care in the future. The group explained what could happen if they did not do any self-care activities at all. The group participated in the intervention "self-care assessment" where they had a chance to discover some of their weaknesses and strengths in self- care. Patient came up with a self-care plan to improve themselves in the future.  Clinical Observations/Feedback: Patient came to group and defined self-care as putting self in environment where you can do well. He expressed that he participates in self-care by eating right, sleeping, and exercising. Participant stated that he can improve his self-care by getting a job, talking to friends and going to church. Individual was social with staff and peers while participating in the intervention. Brett Reid LRT/CTRS         Brett Reid 10/13/2020 4:17 PM

## 2020-10-14 ENCOUNTER — Other Ambulatory Visit (HOSPITAL_COMMUNITY): Payer: Self-pay | Admitting: Behavioral Health

## 2020-10-14 DIAGNOSIS — F22 Delusional disorders: Secondary | ICD-10-CM | POA: Diagnosis present

## 2020-10-14 DIAGNOSIS — I10 Essential (primary) hypertension: Secondary | ICD-10-CM | POA: Diagnosis present

## 2020-10-14 MED ORDER — OLANZAPINE 10 MG PO TABS: 10.0000 mg | ORAL_TABLET | Freq: Every day | ORAL | 0 refills | Status: AC

## 2020-10-14 MED ORDER — OLANZAPINE 10 MG PO TBDP: 10.0000 mg | ORAL_TABLET | Freq: Every day | ORAL | 0 refills | Status: DC

## 2020-10-14 MED ORDER — HYDROXYZINE HCL 50 MG PO TABS: 50.0000 mg | ORAL_TABLET | Freq: Three times a day (TID) | ORAL | 0 refills | Status: DC | PRN

## 2020-10-14 MED ORDER — OLANZAPINE 10 MG PO TABS: 10.0000 mg | ORAL_TABLET | Freq: Every day | ORAL | 1 refills | Status: DC

## 2020-10-14 MED ORDER — AMLODIPINE BESYLATE 5 MG PO TABS: 5.0000 mg | ORAL_TABLET | Freq: Every day | ORAL | 1 refills | Status: AC

## 2020-10-14 MED ORDER — HYDROXYZINE HCL 50 MG PO TABS: 50.0000 mg | ORAL_TABLET | Freq: Three times a day (TID) | ORAL | 1 refills | Status: AC | PRN

## 2020-10-14 MED ORDER — OLANZAPINE 10 MG PO TBDP: 10.0000 mg | ORAL_TABLET | Freq: Every day | ORAL | 1 refills | Status: DC

## 2020-10-14 MED ORDER — AMLODIPINE BESYLATE 5 MG PO TABS
5.0000 mg | ORAL_TABLET | Freq: Every day | ORAL | Status: DC
  Administered 2020-10-14: 5 mg via ORAL
  Filled 2020-10-14: qty 1

## 2020-10-14 MED ORDER — AMLODIPINE BESYLATE 5 MG PO TABS: 5.0000 mg | ORAL_TABLET | Freq: Every day | ORAL | 0 refills | Status: DC

## 2020-10-14 NOTE — Progress Notes (Signed)
Recreation Therapy Notes  INPATIENT RECREATION TR PLAN  Patient Details Name: Brett Reid MRN: [id number] DOB: [birth year] Today's Date: 10/14/2020  Rec Therapy Plan Is patient appropriate for Therapeutic Recreation?: Yes Treatment times per week: at least 3 Estimated Length of Stay: 5-7 days TR Treatment/Interventions: Group participation (Comment)  Discharge Criteria Pt will be discharged from therapy if:: Discharged Treatment plan/goals/alternatives discussed and agreed upon by:: Patient/family  Discharge Summary Short term goals set: Patient will engage in groups without prompting or encouragement from LRT x3 group sessions within 5 recreation therapy group sessions Short term goals met: Complete Progress toward goals comments: Groups attended Which groups?: Other (Comment) (Self-care, Time Management) Reason goals not met: N/A Therapeutic equipment acquired: N/A Reason patient discharged from therapy: Discharge from hospital Pt/family agrees with progress & goals achieved: Yes Date patient discharged from therapy: 10/14/20   Becka Lagasse 10/14/2020, 11:26 AM

## 2020-10-14 NOTE — Discharge Summary (Signed)
Physician Discharge Summary Note  Patient:  Brett Reid is an 55 y.o., male MRN:  [id number] DOB:  1965/07/25 Patient phone:  [phone] (home)  Patient address:   [address] [place] [address],  Total Time spent with patient: 35 minutes- 25 minutes face-to-face contact with patient, 10 minutes documentation, coordination of care, scripts   Date of Admission:  10/12/2020 Date of Discharge: 10/14/2020  Reason for Admission:  55 year old male brought to the hospital under IVC after he informed a cashier that he was having suicidal thoughts  Principal Problem: Delusional disorder [facility]) Discharge Diagnoses: Principal Problem:   Delusional disorder (HCC) Active Problems:   HTN (hypertension)   Past Psychiatric History:  Unreliable historian. States he does not have a psychiatric illness, and denies being on past medications. He does indicate that a therapist would be helpful. Chart indicates he has been seen in the [facility] system in 90s and mid 200s for psychotic symptoms.   Past Medical History: History reviewed. No pertinent past medical history. History reviewed. No pertinent surgical history. Family History: History reviewed. No pertinent family history. Family Psychiatric  History: Denies Social History:  Social History   Substance and Sexual Activity  Alcohol Use Not Currently     Social History   Substance and Sexual Activity  Drug Use Not Currently    Social History   Socioeconomic History  . Marital status: Single    Spouse name: Not on file  . Number of children: Not on file  . Years of education: Not on file  . Highest education level: Not on file  Occupational History  . Not on file  Tobacco Use  . Smoking status: Former Games developer  . Smokeless tobacco: Never Used  Substance and Sexual Activity  . Alcohol use: Not Currently  . Drug use: Not Currently  . Sexual activity: Not on file  Other Topics Concern  . Not on file  Social History Narrative   . Not on file   Social Determinants of Health   Financial Resource Strain: Not on file  Food Insecurity: Not on file  Transportation Needs: Not on file  Physical Activity: Not on file  Stress: Not on file  Social Connections: Not on file    Hospital Course:  55 year old male brought to the hospital under IVC after he informed a Conservation officer, nature that he was having suicidal thoughts. Since arriving to the hospital he has denied suicidal ideations or plans. He continues to have a fixed delusion that his landlords from over 10 years ago are conspiring against him to prevent him from becoming gainfully employed. He notes he has has thoughts of hurting the landlord in the past, but would never act on these thoughts for fear of going to jail or prison. He has no history of violence, and no legal record or upcoming court dates. He denies any visual or auditory hallucinations, and did not appear to be responding to internal stimuli. While in the hospital he was started on Olanzapine and titrated to 10 mg QHS for delusions, anxiety, and mood. Patient did note an improvement in sleep, appetite, and anxiety level. He continues to have this fixed delusion, however, he is not imminently trying to harm himself or anyone else. He is forward thinking and plans to go to [employer] then seek employment so he can find a new place to live. At this time he does not meet criteria for continued involuntary commitment.   Physical Findings: AIMS: Facial and Oral  Movements Muscles of Facial Expression: None, normal Lips and Perioral Area: None, normal Jaw: None, normal Tongue: None, normal,Extremity Movements Upper (arms, wrists, hands, fingers): None, normal Lower (legs, knees, ankles, toes): None, normal, Trunk Movements Neck, shoulders, hips: None, normal, Overall Severity Severity of abnormal movements (highest score from questions above): None, normal Incapacitation due to abnormal movements: None,  normal Patient's awareness of abnormal movements (rate only patient's report): No Awareness, Dental Status Current problems with teeth and/or dentures?: No Does patient usually wear dentures?: No  CIWA:    COWS:     Musculoskeletal: Strength & Muscle Tone: within normal limits Gait & Station: normal Patient leans: N/A    Psychiatric Specialty Exam: General Appearance: Fairly Groomed  Patent attorney::  Fair  Speech:  Clear and Coherent and Normal Rate  Volume:  Normal  Mood:  Euthymic  Affect:  Congruent  Thought Process:  Coherent and Linear  Orientation:  Full (Time, Place, and Person)  Thought Content:  Logical  Suicidal Thoughts:  No  Homicidal Thoughts:  No  Memory:  Immediate;   Fair Recent;   Fair Remote;   Fair  Judgement:  Intact  Insight:  Fair  Psychomotor Activity:  Normal  Concentration:  Fair  Recall:  [employer] of Knowledge:Fair  Language: Fair  Akathisia:  Negative  Handed:  Right  AIMS (if indicated):     Assets:  Communication Skills Desire for Improvement Physical Health Resilience Social Support Talents/Skills Vocational/Educational  Sleep:  Number of Hours: 7.5  Cognition: WNL  ADL's:  Intact      Physical Exam: Physical Exam Vitals and nursing note reviewed.  Constitutional:      Appearance: Normal appearance.  HENT:     Head: Normocephalic and atraumatic.     Right Ear: External ear normal.     Left Ear: External ear normal.     Nose: Nose normal.     Mouth/Throat:     Mouth: Mucous membranes are moist.     Pharynx: Oropharynx is clear.  Eyes:     Extraocular Movements: Extraocular movements intact.     Conjunctiva/sclera: Conjunctivae normal.     Pupils: Pupils are equal, round, and reactive to light.  Cardiovascular:     Rate and Rhythm: Normal rate.     Pulses: Normal pulses.  Pulmonary:     Effort: Pulmonary effort is normal.     Breath sounds: Normal breath sounds.  Abdominal:     General: Abdomen is flat.      Palpations: Abdomen is soft.  Musculoskeletal:        General: No swelling. Normal range of motion.  Skin:    General: Skin is warm and dry.  Neurological:     General: No focal deficit present.     Mental Status: He is alert and oriented to person, place, and time.  Psychiatric:        Attention and Perception: Attention and perception normal.        Mood and Affect: Mood and affect normal.        Speech: Speech normal.        Behavior: Behavior is cooperative.        Thought Content: Thought content is delusional. Thought content does not include homicidal or suicidal ideation. Thought content does not include homicidal or suicidal plan.        Cognition and Memory: Cognition and memory normal.        Judgment: Judgment normal.    Review of  Systems  Constitutional: Negative for appetite change and fatigue.  HENT: Negative for rhinorrhea and sore throat.   Eyes: Negative for photophobia and visual disturbance.  Respiratory: Negative for cough and shortness of breath.   Cardiovascular: Negative for chest pain and palpitations.  Gastrointestinal: Negative for constipation, diarrhea, nausea and vomiting.  Endocrine: Negative for cold intolerance and heat intolerance.  Genitourinary: Negative for difficulty urinating and dysuria.  Musculoskeletal: Negative for arthralgias and myalgias.  Skin: Negative for rash and wound.  Allergic/Immunologic: Positive for environmental allergies. Negative for food allergies.  Neurological: Negative for dizziness and headaches.  Hematological: Negative for adenopathy. Does not bruise/bleed easily.  Psychiatric/Behavioral: Negative for agitation, behavioral problems, hallucinations and suicidal ideas.  Blood pressure (!) 151/88, pulse 79, temperature 98.2 F (36.8 C), resp. rate 17, height 5\' 11"  (1.803 m), weight 86.6 kg, SpO2 100 %. Body mass index is 26.64 kg/m.   Have you used any form of tobacco in the last 30 days? (Cigarettes, Smokeless  Tobacco, Cigars, and/or Pipes): No  Has this patient used any form of tobacco in the last 30 days? (Cigarettes, Smokeless Tobacco, Cigars, and/or Pipes) No  Blood Alcohol level:  Lab Results  Component Value Date   ETH <10 10/09/2020    Metabolic Disorder Labs:  Lab Results  Component Value Date   HGBA1C 5.6 10/13/2020   MPG 114.02 10/13/2020   No results found for: PROLACTIN Lab Results  Component Value Date   CHOL 171 10/13/2020   TRIG 201 (H) 10/13/2020   HDL 48 10/13/2020   CHOLHDL 3.6 10/13/2020   VLDL 40 10/13/2020   LDLCALC 83 10/13/2020    See Psychiatric Specialty Exam and Suicide Risk Assessment completed by Attending Physician prior to discharge.  Discharge destination:  Other:  10/15/2020  Is patient on multiple antipsychotic therapies at discharge:  No   Has Patient had three or more failed trials of antipsychotic monotherapy by history:  No  Recommended Plan for Multiple Antipsychotic Therapies: NA  Discharge Instructions    Diet - low sodium heart healthy   Complete by: As directed    Increase activity slowly   Complete by: As directed      Allergies as of 10/14/2020      Reactions   Haloperidol    Other reaction(s): NEAR SYNCOPE      Medication List    TAKE these medications     Indication  amLODipine 5 MG tablet Commonly known as: NORVASC Take 1 tablet (5 mg total) by mouth daily.  Indication: High Blood Pressure Disorder   hydrOXYzine 50 MG tablet Commonly known as: ATARAX/VISTARIL Take 1 tablet (50 mg total) by mouth 3 (three) times daily as needed for anxiety.  Indication: Feeling Anxious   OLANZapine 10 MG tablet Commonly known as: ZyPREXA Take 1 tablet (10 mg total) by mouth at bedtime.  Indication: Delusions of Parasitosis       Follow-up Information    Pc, 10/16/2020 Follow up.   Why: Walk in hours are from 9-4 Monday through Friday. Thanks! Contact information: [address] [place]  [place] [id number]               Follow-up recommendations:  Activity:  as tolerated Diet:  low sodium heart healthy diet  Comments:  7-day supply of free medications provided to patient at discharge along with printed 30-day scripts with 1 refill  Signed: [phone], MD 10/14/2020, 10:38 AM

## 2020-10-14 NOTE — Tx Team (Signed)
Interdisciplinary Treatment and Diagnostic Plan Update  10/14/2020 Time of Session: 9:00AM Brett Reid MRN: [id number]  Principal Diagnosis: Schizophrenia, paranoid [facility])  Secondary Diagnoses: Principal Problem:   Schizophrenia, paranoid (HCC)   Current Medications:  Current Facility-Administered Medications  Medication Dose Route Frequency Provider Last Rate Last Admin  . acetaminophen (TYLENOL) tablet 650 mg  650 mg Oral Q6H PRN Clapacs, John T, MD      . alum & mag hydroxide-simeth (MAALOX/MYLANTA) 200-200-20 MG/5ML suspension 30 mL  30 mL Oral Q4H PRN Clapacs, John T, MD      . benzocaine (ORAJEL) 10 % mucosal gel   Mouth/Throat TID PRN Jesse Sans, MD   Given at 10/13/20 1648  . hydrOXYzine (ATARAX/VISTARIL) tablet 50 mg  50 mg Oral TID PRN Clapacs, John T, MD      . magnesium hydroxide (MILK OF MAGNESIA) suspension 30 mL  30 mL Oral Daily PRN Clapacs, John T, MD      . OLANZapine zydis (ZYPREXA) disintegrating tablet 10 mg  10 mg Oral QHS Jesse Sans, MD   10 mg at 10/13/20 2117   PTA Medications: No medications prior to admission.    Patient Stressors: Marital or family conflict Occupational concerns  Patient Strengths: Ability for insight Supportive family/friends  Treatment Modalities: Medication Management, Group therapy, Case management,  1 to 1 session with clinician, Psychoeducation, Recreational therapy.   Physician Treatment Plan for Primary Diagnosis: Schizophrenia, paranoid (HCC) Long Term Goal(s): Improvement in symptoms so as ready for discharge Improvement in symptoms so as ready for discharge   Short Term Goals: Ability to identify changes in lifestyle to reduce recurrence of condition will improve Ability to verbalize feelings will improve Ability to disclose and discuss suicidal ideas Ability to demonstrate self-control will improve Ability to identify and develop effective coping behaviors will improve Ability to maintain clinical  measurements within normal limits will improve Compliance with prescribed medications will improve Ability to identify changes in lifestyle to reduce recurrence of condition will improve Ability to verbalize feelings will improve Ability to disclose and discuss suicidal ideas Ability to demonstrate self-control will improve Ability to identify and develop effective coping behaviors will improve Ability to maintain clinical measurements within normal limits will improve Compliance with prescribed medications will improve  Medication Management: Evaluate patient's response, side effects, and tolerance of medication regimen.  Therapeutic Interventions: 1 to 1 sessions, Unit Group sessions and Medication administration.  Evaluation of Outcomes: Progressing  Physician Treatment Plan for Secondary Diagnosis: Principal Problem:   Schizophrenia, paranoid (HCC)  Long Term Goal(s): Improvement in symptoms so as ready for discharge Improvement in symptoms so as ready for discharge   Short Term Goals: Ability to identify changes in lifestyle to reduce recurrence of condition will improve Ability to verbalize feelings will improve Ability to disclose and discuss suicidal ideas Ability to demonstrate self-control will improve Ability to identify and develop effective coping behaviors will improve Ability to maintain clinical measurements within normal limits will improve Compliance with prescribed medications will improve Ability to identify changes in lifestyle to reduce recurrence of condition will improve Ability to verbalize feelings will improve Ability to disclose and discuss suicidal ideas Ability to demonstrate self-control will improve Ability to identify and develop effective coping behaviors will improve Ability to maintain clinical measurements within normal limits will improve Compliance with prescribed medications will improve     Medication Management: Evaluate patient's  response, side effects, and tolerance of medication regimen.  Therapeutic Interventions: 1 to 1 sessions,  Unit Group sessions and Medication administration.  Evaluation of Outcomes: Progressing   RN Treatment Plan for Primary Diagnosis: Schizophrenia, paranoid (HCC) Long Term Goal(s): Knowledge of disease and therapeutic regimen to maintain health will improve  Short Term Goals: Ability to verbalize frustration and anger appropriately will improve, Ability to demonstrate self-control, Ability to participate in decision making will improve, Ability to verbalize feelings will improve, Ability to identify and develop effective coping behaviors will improve and Compliance with prescribed medications will improve  Medication Management: RN will administer medications as ordered by provider, will assess and evaluate patient's response and provide education to patient for prescribed medication. RN will report any adverse and/or side effects to prescribing provider.  Therapeutic Interventions: 1 on 1 counseling sessions, Psychoeducation, Medication administration, Evaluate responses to treatment, Monitor vital signs and CBGs as ordered, Perform/monitor CIWA, COWS, AIMS and Fall Risk screenings as ordered, Perform wound care treatments as ordered.  Evaluation of Outcomes: Progressing   LCSW Treatment Plan for Primary Diagnosis: Schizophrenia, paranoid (HCC) Long Term Goal(s): Safe transition to appropriate next level of care at discharge, Engage patient in therapeutic group addressing interpersonal concerns.  Short Term Goals: Engage patient in aftercare planning with referrals and resources, Increase social support, Increase ability to appropriately verbalize feelings, Increase emotional regulation, Facilitate acceptance of mental health diagnosis and concerns, Identify triggers associated with mental health/substance abuse issues and Increase skills for wellness and recovery  Therapeutic  Interventions: Assess for all discharge needs, 1 to 1 time with Social worker, Explore available resources and support systems, Assess for adequacy in community support network, Educate family and significant other(s) on suicide prevention, Complete Psychosocial Assessment, Interpersonal group therapy.  Evaluation of Outcomes: Progressing   Progress in Treatment: Attending groups: Yes. Participating in groups: Yes. Taking medication as prescribed: Yes. Toleration medication: Yes. Family/Significant other contact made: No, will contact:  when given permission. Patient understands diagnosis: No. Discussing patient identified problems/goals with staff: Yes. Medical problems stabilized or resolved: Yes. Denies suicidal/homicidal ideation: Yes. Issues/concerns per patient self-inventory: No. Other: None.  New problem(s) identified: No, Describe:  None.  New Short Term/Long Term Goal(s): elimination of symptoms of psychosis, medication management for mood stabilization; elimination of SI thoughts; development of comprehensive mental wellness plan.  Patient Goals: "Vacancy at a shelter."   Discharge Plan or Barriers: Pt would like to discharge to a shelter where he can also work. CSW will assist with an appropriate discharge/aftercare plan.  Reason for Continuation of Hospitalization: Delusions  Medication stabilization  Estimated Length of Stay: 1-7 days  Attendees: Patient: Brett Reid 10/14/2020 9:39 AM  Physician: Les Pou, MD 10/14/2020 9:39 AM  Nursing: Cecille Amsterdam, RN 10/14/2020 9:39 AM  RN Care Manager: 10/14/2020 9:39 AM  Social Worker: Vilma Meckel. Algis Greenhouse, MSW, [place], LCAS 10/14/2020 9:39 AM  Recreational Therapist: Hilbert Bible, LRT 10/14/2020 9:39 AM  Other: Kiva [place], MSW, LCSW-A 10/14/2020 9:39 AM  Other:  10/14/2020 9:39 AM  Other: 10/14/2020 9:39 AM    Scribe for Treatment Team: Glenis Smoker, LCSW 10/14/2020 9:39 AM

## 2020-10-14 NOTE — Progress Notes (Signed)
Patient denies SI/HI, denies A/V hallucinations. Patient verbalizes understanding of discharge instructions, follow up care and prescriptions. 7 days medicines given to patient.Patient given all belongings from [facility] locker. Patient escorted out by staff, transported by safe transport. 

## 2020-10-14 NOTE — [facility] Suicide Risk Assessment (Signed)
[facility] Discharge Suicide Risk Assessment   Principal Problem: Delusional disorder [facility]) Discharge Diagnoses: Principal Problem:   Delusional disorder (HCC) Active Problems:   HTN (hypertension)   Total Time spent with patient: 35 minutes- 25 minutes face-to-face contact with patient, 10 minutes documentation, coordination of care, scripts   Musculoskeletal: Strength & Muscle Tone: within normal limits Gait & Station: normal Patient leans: N/A  Psychiatric Specialty Exam: Review of Systems  Constitutional: Negative for appetite change and fatigue.  HENT: Negative for rhinorrhea and sore throat.   Eyes: Negative for photophobia and visual disturbance.  Respiratory: Negative for cough and shortness of breath.   Cardiovascular: Negative for chest pain and palpitations.  Gastrointestinal: Negative for constipation, diarrhea, nausea and vomiting.  Endocrine: Negative for cold intolerance and heat intolerance.  Genitourinary: Negative for difficulty urinating and dysuria.  Musculoskeletal: Negative for arthralgias and myalgias.  Skin: Negative for rash and wound.  Allergic/Immunologic: Positive for environmental allergies. Negative for food allergies.  Neurological: Negative for dizziness and headaches.  Hematological: Negative for adenopathy. Does not bruise/bleed easily.  Psychiatric/Behavioral: Negative for agitation, behavioral problems, hallucinations and suicidal ideas.    Blood pressure (!) 151/88, pulse 79, temperature 98.2 F (36.8 C), resp. rate 17, height 5\' 11"  (1.803 m), weight 86.6 kg, SpO2 100 %.Body mass index is 26.64 kg/m.  General Appearance: Fairly Groomed  ::  Fair  Speech:  Clear and Coherent and Normal Rate  Volume:  Normal  Mood:  Euthymic  Affect:  Congruent  Thought Process:  Coherent and Linear  Orientation:  Full (Time, Place, and Person)  Thought Content:  Logical  Suicidal Thoughts:  No  Homicidal Thoughts:  No  Memory:  Immediate;    Fair Recent;   Fair Remote;   Fair  Judgement:  Intact  Insight:  Fair  Psychomotor Activity:  Normal  Concentration:  Fair  Recall:  [phone] of Knowledge:Fair  Language: Fair  Akathisia:  Negative  Handed:  Right  AIMS (if indicated):     Assets:  Communication Skills Desire for Improvement Physical Health Resilience Social Support Talents/Skills Vocational/Educational  Sleep:  Number of Hours: 7.5  Cognition: WNL  ADL's:  Intact   Mental Status Per Nursing Assessment::   On Admission:  NA  Demographic Factors:  Male, Caucasian and Unemployed  Loss Factors: NA  Historical Factors: Impulsivity  Risk Reduction Factors:   Living with another person, especially a relative, Positive social support, Positive therapeutic relationship and Positive coping skills or problem solving skills  Continued Clinical Symptoms:  Severe Anxiety and/or Agitation Previous Psychiatric Diagnoses and Treatments  Cognitive Features That Contribute To Risk:  Closed-mindedness    Suicide Risk:  Minimal: No identifiable suicidal ideation.  Patients presenting with no risk factors but with morbid ruminations; may be classified as minimal risk based on the severity of the depressive symptoms   Follow-up Information    Pc, [phone] Follow up.   Why: Walk in hours are from 9-4 Monday through Friday. Thanks! Contact information: [phone] [place] [address]               Plan Of Care/Follow-up recommendations:  Activity:  as tolerated Diet:  low sodium heart healthy diet  [phone], MD 10/14/2020, 10:25 AM

## 2020-10-14 NOTE — Progress Notes (Addendum)
Recreation Therapy Notes  Date: 10/14/2020  Time: 9:30 am   Location: Craft room   Behavioral response: Appropriate  Intervention Topic: Time- Management   Discussion/Intervention:  Group content today was focused on time management. The group defined time management and identified healthy ways to manage time. Individuals expressed how much of the 24 hours they use in a day. Patients expressed how much time they use just for themselves personally. The group expressed how they have managed their time in the past. Individuals participated in the intervention "Managing Life" where they had a chance to see how much of the 24 hours they use and where it goes. Clinical Observations/Feedback: Patient came to group and defined time management as getting things done in a timely manner when you have many responsibilities. He explained that he manages his time by doing a little bit here and there. Individual was social with staff and peers while participating in the intervention. [name] LRT/CTRS         Joretta Eads 10/14/2020 11:11 AM

## 2020-10-14 NOTE — Progress Notes (Signed)
  [facility] Adult Case Management Discharge Plan :  Will you be returning to the same living situation after discharge:  No. At discharge, do you have transportation home?: Yes,  CSW will arrange transportation. Do you have the ability to pay for your medications: No.  Release of information consent forms completed and in the chart;  Patient's signature needed at discharge.  Patient to Follow up at:  Follow-up Information    [facility], [employer] Follow up.   Why: Walk in hours are from 9-4 Monday through Friday. Thanks! Contact information: [address] [place] [address] [phone]               Next level of care provider has access to [facility] Link:no  Safety Planning and Suicide Prevention discussed: Yes,  SPE completed with pt.  Have you used any form of tobacco in the last 30 days? (Cigarettes, Smokeless Tobacco, Cigars, and/or Pipes): No  Has patient been referred to the Quitline?: Patient refused referral  Patient has been referred for addiction treatment: Pt. refused referral  Glenis Smoker, LCSW 10/14/2020, 10:42 AM

## 2020-10-14 NOTE — [facility] Group Notes (Signed)
LCSW Group Therapy Note  10/14/2020 2:02 PM  Type of Therapy/Topic:  Group Therapy:  Emotion Regulation  Participation Level:  Minimal   Description of Group:   The purpose of this group is to assist patients in learning to regulate negative emotions and experience positive emotions. Patients will be guided to discuss ways in which they have been vulnerable to their negative emotions. These vulnerabilities will be juxtaposed with experiences of positive emotions or situations, and patients will be challenged to use positive emotions to combat negative ones. Special emphasis will be placed on coping with negative emotions in conflict situations, and patients will process healthy conflict resolution skills.  Therapeutic Goals: 1. Patient will identify two positive emotions or experiences to reflect on in order to balance out negative emotions 2. Patient will label two or more emotions that they find the most difficult to experience 3. Patient will demonstrate positive conflict resolution skills through discussion and/or role plays  Summary of Patient Progress: Patient was present for the entirety of group. He spoke when called on directly but outside of that it was minimal. He did appear to attend to the conversation.   Therapeutic Modalities:   Cognitive Behavioral Therapy Feelings Identification Dialectical Behavioral Therapy  Vilma Meckel. Algis Greenhouse, MSW, LCSW, LCAS 10/14/2020 2:02 PM

## 2020-10-21 NOTE — Congregational Nurse Program (Signed)
  Dept: [phone]   Congregational Nurse Program Note  Date of Encounter: 10/20/2020 Client to clinic for blood pressure check. BP 150/82, client unclear regarding medications he is taking. RN to continue to monitor and build repore with client.  Past Medical History: No past medical history on file.  Encounter Details:  CNP Questionnaire - 10/29/20 1244      Questionnaire   Do you give verbal consent to treat you today? Yes    Location Patient Served At [facility]    Patient Status Homeless    Medical Provider No    Insurance Unknown    Housing/Utilities No permanent housing   lives on the street   Transportation Need transportation assistance    Interpersonal Safety Do not feel physically and emotionally safe where you currently live    Food Have food insecurities    Medication Have medication insecurities    Referrals Other    ED Visit Averted --   n/a   Life-Saving Intervention Made --   n/a

## 2022-04-05 NOTE — Congregational Nurse Program (Signed)
  Dept: [phone]   Congregational Nurse Program Note  Date of Encounter: 04/05/2022 Client to [facility] to discuss "tests men my age need". Client is aware of need for a baseline PSA and colonoscopy. He could have a PSA drawn if he chooses to become a client at the [facility]. They could refer him for the colonoscopy. He would need [facility] care as he only has [employer] medicaid. Rn to attempt to discuss an referral to the [facility] at client's next visit.  Past Medical History: No past medical history on file.  Encounter Details:  CNP Questionnaire - 04/05/22 1110       Questionnaire   Do you give verbal consent to treat you today? Yes    Location Patient [facility] Organization    Patient Status Homeless    Insurance Unknown   Family planning Medicaid only   Insurance Referral N/A    Medication N/A   unsure if client is taking any medications at this time.   Medical Provider No    Screening Referrals N/A    Medical Referral Other;N/A   will address a referral to [facility] at next visit.   Medical Appointment Made N/A    Food Have Food Insecurities    Transportation N/A   client [place] use the [employer] [place] system and gets himself around   Housing/Utilities No permanent housing   lives on the street   Interpersonal Safety N/A    Intervention Case Management;Support    ED Visit Averted N/A   n/a   Life-Saving Intervention Made N/A   n/a

## 2024-02-28 NOTE — Congregational Nurse Program (Signed)
  Dept: [phone]   Congregational Nurse Program Note  Date of Encounter: 02/28/2024 Client to [facility] day center, nurse led clinic with questions about his [facility]. RN was able to advise him that based on information she could find, [facility] is not a provider in [facility] where he plans to go next. Client is known to this RN from past times he has come to [facility], he does not usually engage with or request any nursing services. He is known to travel from state to state and will resurface in [place] every 6-7 months. He also wanted to know if the Jordan he had in his prescription bottle was really Latuda. RN was able to use the website Pill Identifier to assure him that he had the correct pill.  MARLA Marina BSN, RN Past Medical History: No past medical history on file.  Encounter Details:  Community Questionnaire - 02/28/24 1029       Questionnaire   Ask client: Do you give verbal consent for me to treat you today? Yes    Student Assistance N/A    Location Patient Served  [facility]    Encounter Setting CN site    Population Status Unhoused    Ins[place]   [employer]   Insurance/Financial Assistance Referral N/A    Medication Patient Medications Reviewed    Medical Provider No    Screening Referrals Made N/A    Medical Referrals Made N/A    Medical Appointment Completed N/A    CNP Interventions Advocate/Support;Educate    ED Visit Averted N/A   n/a   Life-Saving Intervention Made N/A   n/a

## 2024-05-10 ENCOUNTER — Other Ambulatory Visit: Payer: Self-pay

## 2024-05-10 ENCOUNTER — Telehealth: Payer: MEDICAID | Admitting: Nurse Practitioner

## 2024-05-10 DIAGNOSIS — F209 Schizophrenia, unspecified: Secondary | ICD-10-CM | POA: Diagnosis not present

## 2024-05-10 MED ORDER — ARIPIPRAZOLE 5 MG PO TABS
5.0000 mg | ORAL_TABLET | Freq: Every day | ORAL | 0 refills | Status: AC
  Filled 2024-05-10: qty 30, 30d supply, fill #0
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
## 2024-05-13 ENCOUNTER — Other Ambulatory Visit: Payer: Self-pay

## 2024-08-01 NOTE — Progress Notes (Signed)
 The patient presented for a primary care visit on 05/10/24 to establish care. Blood pressure screening wasn't conducted. During the appointment, the patient did not report any (SDOH).  A review of the patient's chart revealed that they do currently have a primary care provider (PCP) and no future appointments were indicated. Visits are not visible in chl from this provider. This pt is getting help from the congregational nurses currently. At this time, no additional support from the [facility] team is necessary.
